# Patient Record
Sex: Female | Born: 1960 | Race: Black or African American | Hispanic: No | Marital: Single | Smoking: Former smoker
Health system: Southern US, Community
[De-identification: ages and names within clinical notes are randomized; demographics above are authoritative.]

## PROBLEM LIST (undated history)

## (undated) DIAGNOSIS — F319 Bipolar disorder, unspecified: Secondary | ICD-10-CM

## (undated) DIAGNOSIS — Z21 Asymptomatic human immunodeficiency virus [HIV] infection status: Secondary | ICD-10-CM

## (undated) DIAGNOSIS — J449 Chronic obstructive pulmonary disease, unspecified: Secondary | ICD-10-CM

## (undated) DIAGNOSIS — J45909 Unspecified asthma, uncomplicated: Secondary | ICD-10-CM

## (undated) HISTORY — PX: TUBAL LIGATION: SHX77

---

## 2014-03-25 ENCOUNTER — Emergency Department (HOSPITAL_COMMUNITY)
Admission: EM | Admit: 2014-03-25 | Discharge: 2014-03-26 | Disposition: A | Discharge status: Home / Self Care | Payer: Medicare Other | Attending: Emergency Medicine | Admitting: Emergency Medicine

## 2014-03-25 ENCOUNTER — Encounter (HOSPITAL_COMMUNITY): Payer: Self-pay | Admitting: Emergency Medicine

## 2014-03-25 DIAGNOSIS — Z880 Allergy status to penicillin: Secondary | ICD-10-CM | POA: Insufficient documentation

## 2014-03-25 DIAGNOSIS — Z21 Asymptomatic human immunodeficiency virus [HIV] infection status: Secondary | ICD-10-CM | POA: Insufficient documentation

## 2014-03-25 DIAGNOSIS — R45851 Suicidal ideations: Secondary | ICD-10-CM

## 2014-03-25 DIAGNOSIS — Z3202 Encounter for pregnancy test, result negative: Secondary | ICD-10-CM | POA: Insufficient documentation

## 2014-03-25 DIAGNOSIS — Z91148 Patient's other noncompliance with medication regimen for other reason: Secondary | ICD-10-CM

## 2014-03-25 DIAGNOSIS — Z720 Tobacco use: Secondary | ICD-10-CM | POA: Insufficient documentation

## 2014-03-25 DIAGNOSIS — R451 Restlessness and agitation: Secondary | ICD-10-CM

## 2014-03-25 DIAGNOSIS — F319 Bipolar disorder, unspecified: Secondary | ICD-10-CM

## 2014-03-25 DIAGNOSIS — Z79899 Other long term (current) drug therapy: Secondary | ICD-10-CM | POA: Insufficient documentation

## 2014-03-25 DIAGNOSIS — Z9114 Patient's other noncompliance with medication regimen: Secondary | ICD-10-CM | POA: Insufficient documentation

## 2014-03-25 DIAGNOSIS — F3160 Bipolar disorder, current episode mixed, unspecified: Secondary | ICD-10-CM

## 2014-03-25 DIAGNOSIS — N390 Urinary tract infection, site not specified: Secondary | ICD-10-CM

## 2014-03-25 HISTORY — DX: Bipolar disorder, unspecified: F31.9

## 2014-03-25 HISTORY — DX: Asymptomatic human immunodeficiency virus (hiv) infection status: Z21

## 2014-03-25 LAB — URINALYSIS, ROUTINE W REFLEX MICROSCOPIC
BILIRUBIN URINE: NEGATIVE
Glucose, UA: NEGATIVE mg/dL
HGB URINE DIPSTICK: NEGATIVE
KETONES UR: NEGATIVE mg/dL
Leukocytes, UA: NEGATIVE
NITRITE: POSITIVE — AB
PROTEIN: NEGATIVE mg/dL
SPECIFIC GRAVITY, URINE: 1.017 (ref 1.005–1.030)
UROBILINOGEN UA: 0.2 mg/dL (ref 0.0–1.0)
pH: 7.5 (ref 5.0–8.0)

## 2014-03-25 LAB — URINE MICROSCOPIC-ADD ON

## 2014-03-25 LAB — COMPREHENSIVE METABOLIC PANEL
ALT: 10 U/L (ref 0–35)
ANION GAP: 11 (ref 5–15)
AST: 20 U/L (ref 0–37)
Albumin: 3.7 g/dL (ref 3.5–5.2)
Alkaline Phosphatase: 47 U/L (ref 39–117)
BUN: 9 mg/dL (ref 6–23)
CO2: 25 mEq/L (ref 19–32)
Calcium: 9 mg/dL (ref 8.4–10.5)
Chloride: 103 mEq/L (ref 96–112)
Creatinine, Ser: 0.68 mg/dL (ref 0.50–1.10)
GFR calc Af Amer: >90 mL/min (ref >90)
GFR calc non Af Amer: >90 mL/min (ref >90)
GLUCOSE: 104 mg/dL — AB (ref 70–99)
POTASSIUM: 3.7 meq/L (ref 3.7–5.3)
SODIUM: 139 meq/L (ref 137–147)
Total Protein: 7.7 g/dL (ref 6.0–8.3)

## 2014-03-25 LAB — CBC
HEMATOCRIT: 36.7 % (ref 36.0–46.0)
HEMOGLOBIN: 12.2 g/dL (ref 12.0–15.0)
MCH: 26.6 pg (ref 26.0–34.0)
MCHC: 33.2 g/dL (ref 30.0–36.0)
MCV: 80.1 fL (ref 78.0–100.0)
Platelets: 237 10*3/uL (ref 150–400)
RBC: 4.58 MIL/uL (ref 3.87–5.11)
RDW: 14.9 % (ref 11.5–15.5)
WBC: 5.7 10*3/uL (ref 4.0–10.5)

## 2014-03-25 LAB — RAPID URINE DRUG SCREEN, HOSP PERFORMED
AMPHETAMINES: NOT DETECTED
BARBITURATES: NOT DETECTED
Benzodiazepines: NOT DETECTED
COCAINE: NOT DETECTED
Opiates: NOT DETECTED
Tetrahydrocannabinol: NOT DETECTED

## 2014-03-25 LAB — ETHANOL: Alcohol, Ethyl (B): <11 mg/dL (ref 0–11)

## 2014-03-25 LAB — ACETAMINOPHEN LEVEL

## 2014-03-25 LAB — PREGNANCY, URINE: Preg Test, Ur: NEGATIVE

## 2014-03-25 LAB — SALICYLATE LEVEL: Salicylate Lvl: <2 mg/dL — ABNORMAL LOW (ref 2.8–20.0)

## 2014-03-25 MED ORDER — ALUM & MAG HYDROXIDE-SIMETH 200-200-20 MG/5ML PO SUSP: 30.0000 mL | ORAL | Status: DC | PRN

## 2014-03-25 MED ORDER — ACETAMINOPHEN 325 MG PO TABS: 650.0000 mg | ORAL_TABLET | ORAL | Status: DC | PRN

## 2014-03-25 MED ORDER — NITROFURANTOIN MONOHYD MACRO 100 MG PO CAPS: 100.0000 mg | ORAL_CAPSULE | Freq: Once | ORAL | Status: DC

## 2014-03-25 MED ORDER — ONDANSETRON HCL 4 MG PO TABS: 4.0000 mg | ORAL_TABLET | Freq: Three times a day (TID) | ORAL | Status: DC | PRN

## 2014-03-25 MED ORDER — ZOLPIDEM TARTRATE 5 MG PO TABS
5.0000 mg | ORAL_TABLET | Freq: Every evening | ORAL | Status: DC | PRN
  Administered 2014-03-25: 5 mg via ORAL
  Filled 2014-03-25: qty 1

## 2014-03-25 MED ORDER — NICOTINE 21 MG/24HR TD PT24
21.0000 mg | MEDICATED_PATCH | Freq: Every day | TRANSDERMAL | Status: DC
Start: 2014-03-25 — End: 2014-03-26
  Administered 2014-03-25 – 2014-03-26 (×2): 21 mg via TRANSDERMAL
  Filled 2014-03-25 (×3): qty 1

## 2014-03-25 MED ORDER — NITROFURANTOIN MONOHYD MACRO 100 MG PO CAPS
100.0000 mg | ORAL_CAPSULE | Freq: Two times a day (BID) | ORAL | Status: DC
  Administered 2014-03-25 – 2014-03-26 (×4): 100 mg via ORAL
  Filled 2014-03-25 (×6): qty 1

## 2014-03-25 MED ORDER — IBUPROFEN 200 MG PO TABS: 600.0000 mg | ORAL_TABLET | Freq: Three times a day (TID) | ORAL | Status: DC | PRN

## 2014-03-25 MED ORDER — LORAZEPAM 1 MG PO TABS
1.0000 mg | ORAL_TABLET | Freq: Three times a day (TID) | ORAL | Status: DC | PRN
  Administered 2014-03-25: 1 mg via ORAL
  Filled 2014-03-25: qty 1

## 2014-03-25 MED ORDER — ELVITEG-COBIC-EMTRICIT-TENOFDF 150-150-200-300 MG PO TABS
1.0000 | ORAL_TABLET | Freq: Every day | ORAL | Status: DC
  Administered 2014-03-25 – 2014-03-26 (×2): 1 via ORAL
  Filled 2014-03-25 (×3): qty 1

## 2014-03-25 MED ORDER — QUETIAPINE FUMARATE 25 MG PO TABS
25.0000 mg | ORAL_TABLET | Freq: Every day | ORAL | Status: DC
  Administered 2014-03-25: 25 mg via ORAL
  Filled 2014-03-25: qty 1

## 2014-03-25 MED ORDER — OLANZAPINE 5 MG PO TBDP
5.0000 mg | ORAL_TABLET | Freq: Two times a day (BID) | ORAL | Status: DC
  Administered 2014-03-25 – 2014-03-26 (×3): 5 mg via ORAL
  Filled 2014-03-25 (×3): qty 1

## 2014-03-25 NOTE — ED Notes (Signed)
Pt Seroquel was discontinued because it should not be given with this pt's HIV medication: Stribild. RN called pharmacy and confirmed that these two medication Seroquel and Stribild should not be given together. Advised MD and extender. sbw,rn

## 2014-03-25 NOTE — Consult Note (Signed)
[facility] Consult   Reason for Consult:  angerand suicidal thoughts. Referring Physician:  EDP  Natalie Barrett is an 53 y.o. female. Total Time spent with patient: 20 minutes  Assessment: AXIS I:  Bipolar, mixed AXIS II:  Deferred AXIS III:   Past Medical History  Diagnosis Date  . Bipolar 1 disorder   . HIV antibody positive    AXIS IV:  housing problems, other psychosocial or environmental problems, problems related to social environment and problems with primary support group AXIS V:  41-50 serious symptoms  Plan:  medication management  Subjective:   Natalie Barrett is a 53 y.o. female patient admitted with suicidal thoughts and plan to kill herself by taking overdose.  HPI:  Patient seen chart reviewed.  Patient is 53 year old female who brought to the emergency room because of UTI and after he mentioned that she wants to kill herself by taking overdose on medication.  Patient is superficially cooperative.  She did not talk much and Very upset.  She mentioned that she has anger issues.  She has been noncompliant with her medication.  She has HIV however it is unclear if she is taking her HIV medication.  Currently she is homeless.  She used to live in [place] but recently moved to [place] a few weeks ago.  She endorse financial strains and having issues with her cousin.  Patient continued to express suicidal thoughts and did not contract for safety.  She admitted her depression and anger is getting worse since she is not taking her medication.  Patient denies any hallucination, paranoia or any delusions.  She did not cooperate to provide more information.  Past Psychiatric History: Past Medical History  Diagnosis Date  . Bipolar 1 disorder   . HIV antibody positive     reports that she has been smoking.  She has never used smokeless tobacco. She reports that she does not drink alcohol or use illicit drugs. History reviewed. No pertinent family history. Family  History Substance Abuse: No Family Supports: Yes, List: (Pt cites having close friends.) Living Arrangements: Other (Comment) (Homeless) Can pt return to current living arrangement?: Yes Abuse/Neglect [facility]) Physical Abuse: Denies Verbal Abuse: Denies Sexual Abuse: Denies Allergies:   Allergies  Allergen Reactions  . Penicillins Swelling    ACT Assessment Complete:  Yes:    Educational Status    Risk to Self: Risk to self with the past 6 months Suicidal Ideation: No-Not Currently/Within Last 6 Months Suicidal Intent: No Is patient at risk for suicide?: No Suicidal Plan?: Yes-Currently Present Specify Current Suicidal Plan: Overdose on medications Access to Means: Yes Specify Access to Suicidal Means: Medications What has been your use of drugs/alcohol within the last 12 months?: Denies use in last 11 years. Previous Attempts/Gestures: No How many times?: 0 Other Self Harm Risks: None Triggers for Past Attempts: None known Intentional Self Injurious Behavior: None Family Suicide History: No Recent stressful life event(s): Conflict (Comment);Turmoil (Comment) (Homelessness, conflict w/ cousin.) Persecutory voices/beliefs?: Yes Depression: Yes Depression Symptoms: Despondent;Feeling angry/irritable Substance abuse history and/or treatment for substance abuse?: Yes Suicide prevention information given to non-admitted patients: Not applicable  Risk to Others: Risk to Others within the past 6 months Homicidal Ideation: No Thoughts of Harm to Others: Yes-Currently Present Comment - Thoughts of Harm to Others: Pt states she could choke her cousin if she saw him now. Current Homicidal Intent: No Current Homicidal Plan: No Access to Homicidal Means: No Identified Victim: Natalie Barrett (wants to harm him) History  of harm to others?: No Assessment of Violence: None Noted Violent Behavior Description: Pt denies Does patient have access to weapons?: No Criminal Charges Pending?: No Does  patient have a court date: No  Abuse: Abuse/Neglect Assessment (Assessment to be complete while patient is alone) Physical Abuse: Denies Verbal Abuse: Denies Sexual Abuse: Denies Exploitation of patient/patient's resources: Denies Self-Neglect: Denies  Prior Inpatient Therapy: Prior Inpatient Therapy Prior Inpatient Therapy: Yes Prior Therapy Dates: Four months ago Prior Therapy Facilty/Provider(s): [facility] Reason for Treatment: Bi-polar d/o  Prior Outpatient Therapy: Prior Outpatient Therapy Prior Outpatient Therapy: Yes Prior Therapy Dates: Past 11 years Prior Therapy Facilty/Provider(s): [facility] Reason for Treatment: Bi polar  Additional Information: Additional Information 1:1 In Past 12 Months?: No CIRT Risk: No Elopement Risk: No Does patient have medical clearance?: Yes                  Objective: Blood pressure 120/70, pulse 83, temperature 98.3 F (36.8 C), temperature source Oral, resp. rate 20, height '5\' 4"'  (1.626 m), weight 56.246 kg (124 lb), last menstrual period 02/23/2014, SpO2 98.00%.Body mass index is 21.27 kg/(m^2). Results for orders placed during the hospital encounter of 03/25/14 (from the past 72 hour(s))  ACETAMINOPHEN LEVEL     Status: None   Collection Time    03/25/14  1:12 AM      Result Value Ref Range   Acetaminophen (Tylenol), Serum <15.0  10 - 30 ug/mL   Comment:            THERAPEUTIC CONCENTRATIONS VARY     SIGNIFICANTLY. A RANGE OF 10-30     ug/mL MAY BE AN EFFECTIVE     CONCENTRATION FOR MANY PATIENTS.     HOWEVER, SOME ARE BEST TREATED     AT CONCENTRATIONS OUTSIDE THIS     RANGE.     ACETAMINOPHEN CONCENTRATIONS     >150 ug/mL AT 4 HOURS AFTER     INGESTION AND >50 ug/mL AT 12     HOURS AFTER INGESTION ARE     OFTEN ASSOCIATED WITH TOXIC     REACTIONS.  CBC     Status: None   Collection Time    03/25/14  1:12 AM      Result Value Ref Range   WBC 5.7  4.0 - 10.5 K/uL   RBC 4.58  3.87 - 5.11  MIL/uL   Hemoglobin 12.2  12.0 - 15.0 g/dL   HCT 36.7  36.0 - 46.0 %   MCV 80.1  78.0 - 100.0 fL   MCH 26.6  26.0 - 34.0 pg   MCHC 33.2  30.0 - 36.0 g/dL   RDW 14.9  11.5 - 15.5 %   Platelets 237  150 - 400 K/uL  COMPREHENSIVE METABOLIC PANEL     Status: Abnormal   Collection Time    03/25/14  1:12 AM      Result Value Ref Range   Sodium 139  137 - 147 mEq/L   Potassium 3.7  3.7 - 5.3 mEq/L   Chloride 103  96 - 112 mEq/L   CO2 25  19 - 32 mEq/L   Glucose, Bld 104 (*) 70 - 99 mg/dL   BUN 9  6 - 23 mg/dL   Creatinine, Ser 0.68  0.50 - 1.10 mg/dL   Calcium 9.0  8.4 - 10.5 mg/dL   Total Protein 7.7  6.0 - 8.3 g/dL   Albumin 3.7  3.5 - 5.2 g/dL   AST 20  0 - 37 U/L   ALT 10  0 - 35 U/L   Alkaline Phosphatase 47  39 - 117 U/L   Total Bilirubin <0.2 (*) 0.3 - 1.2 mg/dL   GFR calc non Af Amer >90  >90 mL/min   GFR calc Af Amer >90  >90 mL/min   Comment: (NOTE)     The eGFR has been calculated using the CKD EPI equation.     This calculation has not been validated in all clinical situations.     eGFR's persistently <90 mL/min signify possible Chronic Kidney     Disease.   Anion gap 11  5 - 15  ETHANOL     Status: None   Collection Time    03/25/14  1:12 AM      Result Value Ref Range   Alcohol, Ethyl (B) <11  0 - 11 mg/dL   Comment:            LOWEST DETECTABLE LIMIT FOR     SERUM ALCOHOL IS 11 mg/dL     FOR MEDICAL PURPOSES ONLY  SALICYLATE LEVEL     Status: Abnormal   Collection Time    03/25/14  1:12 AM      Result Value Ref Range   Salicylate Lvl <9.3 (*) 2.8 - 20.0 mg/dL  URINE RAPID DRUG SCREEN (HOSP PERFORMED)     Status: None   Collection Time    03/25/14  1:12 AM      Result Value Ref Range   Opiates NONE DETECTED  NONE DETECTED   Cocaine NONE DETECTED  NONE DETECTED   Benzodiazepines NONE DETECTED  NONE DETECTED   Amphetamines NONE DETECTED  NONE DETECTED   Tetrahydrocannabinol NONE DETECTED  NONE DETECTED   Barbiturates NONE DETECTED  NONE DETECTED    Comment:            DRUG SCREEN FOR MEDICAL PURPOSES     ONLY.  IF CONFIRMATION IS NEEDED     FOR ANY PURPOSE, NOTIFY LAB     WITHIN 5 DAYS.                LOWEST DETECTABLE LIMITS     FOR URINE DRUG SCREEN     Drug Class       Cutoff (ng/mL)     Amphetamine      1000     Barbiturate      200     Benzodiazepine   235     Tricyclics       573     Opiates          300     Cocaine          300     THC              50  URINALYSIS, ROUTINE W REFLEX MICROSCOPIC     Status: Abnormal   Collection Time    03/25/14  1:12 AM      Result Value Ref Range   Color, Urine YELLOW  YELLOW   APPearance CLOUDY (*) CLEAR   Specific Gravity, Urine 1.017  1.005 - 1.030   pH 7.5  5.0 - 8.0   Glucose, UA NEGATIVE  NEGATIVE mg/dL   Hgb urine dipstick NEGATIVE  NEGATIVE   Bilirubin Urine NEGATIVE  NEGATIVE   Ketones, ur NEGATIVE  NEGATIVE mg/dL   Protein, ur NEGATIVE  NEGATIVE mg/dL   Urobilinogen, UA 0.2  0.0 - 1.0 mg/dL   Nitrite POSITIVE (*)  NEGATIVE   Leukocytes, UA NEGATIVE  NEGATIVE  PREGNANCY, URINE     Status: None   Collection Time    03/25/14  1:12 AM      Result Value Ref Range   Preg Test, Ur NEGATIVE  NEGATIVE   Comment:            THE SENSITIVITY OF THIS     METHODOLOGY IS >20 mIU/mL.  URINE MICROSCOPIC-ADD ON     Status: Abnormal   Collection Time    03/25/14  1:12 AM      Result Value Ref Range   Squamous Epithelial / LPF FEW (*) RARE   Bacteria, UA MANY (*) RARE   Urine-Other MUCOUS PRESENT     Labs are reviewed.  Current Facility-Administered Medications  Medication Dose Route Frequency Provider Last Rate Last Dose  . acetaminophen (TYLENOL) tablet 650 mg  650 mg Oral Q4H PRN Antonietta Breach, PA-C      . alum & mag hydroxide-simeth (MAALOX/MYLANTA) 200-200-20 MG/5ML suspension 30 mL  30 mL Oral PRN Antonietta Breach, PA-C      . elvitegravir-cobicistat-emtricitabine-tenofovir (STRIBILD) 150-150-200-300 MG tablet 1 tablet  1 tablet Oral Q breakfast Antonietta Breach, PA-C   1 tablet at  03/25/14 0847  . ibuprofen (ADVIL,MOTRIN) tablet 600 mg  600 mg Oral Q8H PRN Antonietta Breach, PA-C      . LORazepam (ATIVAN) tablet 1 mg  1 mg Oral Q8H PRN Antonietta Breach, PA-C   1 mg at 03/25/14 0229  . nicotine (NICODERM CQ - dosed in mg/24 hours) patch 21 mg  21 mg Transdermal Daily Antonietta Breach, PA-C   21 mg at 03/25/14 0848  . nitrofurantoin (macrocrystal-monohydrate) (MACROBID) capsule 100 mg  100 mg Oral BID WC Antonietta Breach, PA-C   100 mg at 03/25/14 0848  . OLANZapine zydis (ZYPREXA) disintegrating tablet 5 mg  5 mg Oral BID Kathlee Nations, MD   5 mg at 03/25/14 1009  . ondansetron (ZOFRAN) tablet 4 mg  4 mg Oral Q8H PRN Antonietta Breach, PA-C      . zolpidem (AMBIEN) tablet 5 mg  5 mg Oral QHS PRN Antonietta Breach, PA-C   5 mg at 03/25/14 0229   Current Outpatient Prescriptions  Medication Sig Dispense Refill  . elvitegravir-cobicistat-emtricitabine-tenofovir (STRIBILD) 150-150-200-300 MG TABS tablet Take 1 tablet by mouth daily with breakfast.      . QUEtiapine (SEROQUEL) 25 MG tablet Take 25 mg by mouth at bedtime.        Psychiatric Specialty Exam:     Blood pressure 120/70, pulse 83, temperature 98.3 F (36.8 C), temperature source Oral, resp. rate 20, height '5\' 4"'  (1.626 m), weight 56.246 kg (124 lb), last menstrual period 02/23/2014, SpO2 98.00%.Body mass index is 21.27 kg/(m^2).  General Appearance: Disheveled and Guarded  Eye Contact::  Poor  Speech:  Pressured  Volume:  Increased  Mood:  Depressed, Hopeless and Irritable  Affect:  Constricted, Depressed and Labile  Thought Process:  Circumstantial and Loose  Orientation:  Full (Time, Place, and Person)  Thought Content:  Rumination  Suicidal Thoughts:  Yes.  with intent/plan  Homicidal Thoughts:  No  Memory:  Immediate;   Poor Recent;   Fair Remote;   Fair  Judgement:  Impaired  Insight:  Shallow  Psychomotor Activity:  Increased  Concentration:  Poor  Recall:  Poor  Fund of Knowledge:Fair  Language: Fair  Akathisia:  No   Handed:  Right  AIMS (if indicated):     Assets:  Communication  Skills  Sleep:      Musculoskeletal: Strength & Muscle Tone: within normal limits Gait & Station: normal Patient leans: N/A  Treatment Plan Summary: Medication management, start Zyprexa 5 mg twice a day.  Increase collateral information from the family.  Stabilize patient and to reevaluate in the morning.  Treat UTI and restart her at HIV medication.  ARFEEN,SYED T. 03/25/2014 4:49 PM

## 2014-03-25 NOTE — ED Provider Notes (Signed)
Medical screening examination/treatment/procedure(s) were performed by non-physician practitioner and as supervising physician I was immediately available for consultation/collaboration.   EKG Interpretation None        Tomasita CrumbleAdeleke Ebany Bowermaster, MD 03/25/14 1340

## 2014-03-25 NOTE — BH Assessment (Signed)
Assessment Note  Natalie Barrett is an 53 y.o. female.  -Clinician noted the documentation provided by Antony MaduraKelly Humes, PA.  She saw patient who initially came in because of UTI and thoughts of killing herself by overdosing.  Patient is very upset with her cousin.  She had been living in [place] and moved to [place] in the last few weeks to help her cousin who has health problems.  Patient herself says she has bipolar d/o and HIV.  She says that cousin has cheated her out of money and she has had to move since the initial move to [place].  At this time she is homeless.  She goes to length to talk about how much she is angry with cousin.  When asked if she was having thoughts about wanting to hurt anyone she says "If my cousin were here I would choke him."  When asked if she wants to kill herself she says, "no."  She does say however that she knows her depression is getting worse and that she needed to seek help.  Patient has been without her seroquel because of the move from [place].  Patient denies any previous actual attempt.  She does say that she feels like killing herself often.  Patient denies A/V hallucinations.  -Clinician [facility].  We discussed patient being seen by psychiatry in the AM to determine disposition.    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 Bipolar 1 disorder   . HIV antibody positive    Axis IV: economic problems, housing problems, occupational problems and problems with primary support group Axis V: 41-50 serious symptoms  Past Medical History:  Past Medical History  Diagnosis Date  . Bipolar 1 disorder   . HIV antibody positive     History reviewed. No pertinent past surgical history.  Family History: History reviewed. No pertinent family history.  Social History:  reports that she has been smoking.  She has never used smokeless tobacco. She reports that she does not drink  alcohol or use illicit drugs.  Additional Social History:  Alcohol / Drug Use Pain Medications: See PTA medication list Prescriptions: See PTA medication list Over the Counter: See PTA medication list History of alcohol / drug use?: No history of alcohol / drug abuse (Pt reports not using ETOH or drugs in 11 years.)  CIWA: CIWA-Ar BP: 138/95 mmHg Pulse Rate: 88 COWS:    Allergies:  Allergies  Allergen Reactions  . Penicillins Swelling    Home Medications:  (Not in a hospital admission)  OB/GYN Status:  Patient's last menstrual period was 02/23/2014.  General Assessment Data Location of Assessment: [facility] ED Is this a Tele or Face-to-Face Assessment?: Face-to-Face Is this an Initial Assessment or a Re-assessment for this encounter?: Initial Assessment Living Arrangements: Other (Comment) (Homeless) Can pt return to current living arrangement?: Yes Admission Status: Voluntary Is patient capable of signing voluntary admission?: Yes Transfer from: Acute Hospital Referral Source: Self/Family/Friend     [facility] Living Arrangements: Other (Comment) (Homeless) [facility] Name of Therapist: None     Risk to self with the past 6 months Suicidal Ideation: No-Not Currently/Within Last 6 Months Suicidal Intent: No Is patient at risk for suicide?: No Suicidal Plan?: Yes-Currently Present Specify Current Suicidal Plan: Overdose on medications Access to Means: Yes Specify Access to Suicidal Means: Medications What has been your use of  drugs/alcohol within the last 12 months?: Denies use in last 11 years. Previous Attempts/Gestures: No How many times?: 0 Other Self Harm Risks: None Triggers for Past Attempts: None known Intentional Self Injurious Behavior: None Family Suicide History: No Recent stressful life event(s): Conflict (Comment);Turmoil (Comment) (Homelessness, conflict w/ cousin.) Persecutory voices/beliefs?:  Yes Depression: Yes Depression Symptoms: Despondent;Feeling angry/irritable Substance abuse history and/or treatment for substance abuse?: Yes Suicide prevention information given to non-admitted patients: Not applicable  Risk to Others within the past 6 months Homicidal Ideation: No Thoughts of Harm to Others: Yes-Currently Present Comment - Thoughts of Harm to Others: Pt states she could choke her cousin if she saw him now. Current Homicidal Intent: No Current Homicidal Plan: No Access to Homicidal Means: No Identified Victim: Wonda OldsCousin (wants to harm him) History of harm to others?: No Assessment of Violence: None Noted Violent Behavior Description: Pt denies Does patient have access to weapons?: No Criminal Charges Pending?: No Does patient have a court date: No  Psychosis Hallucinations: None noted Delusions: None noted  Mental Status Report Appear/Hygiene: Disheveled;In scrubs Eye Contact: Good Motor Activity: Freedom of movement;Unremarkable Speech: Logical/coherent Level of Consciousness: Alert;Irritable Mood: Anxious;Irritable;Helpless Affect: Anxious;Irritable Anxiety Level: Moderate Thought Processes: Coherent;Tangential Judgement: Unimpaired Orientation: Person;Place;Situation Obsessive Compulsive Thoughts/Behaviors: None  Cognitive Functioning Concentration: Normal Memory: Recent Intact;Remote Intact IQ: Average Insight: Fair Impulse Control: Good Appetite: Good Weight Loss: 0 Weight Gain: 0 Sleep: No Change Total Hours of Sleep: 6 Vegetative Symptoms: None  ADLScreening [facility]) Patient's cognitive ability adequate to safely complete daily activities?: Yes Patient able to express need for assistance with ADLs?: Yes Independently performs ADLs?: Yes (appropriate for developmental age)  Prior Inpatient Therapy Prior Inpatient Therapy: Yes Prior Therapy Dates: Four months ago Prior Therapy Facilty[facility]Provider[facility] Reason for  Treatment: Bi-polar d/o  Prior Outpatient Therapy Prior Outpatient Therapy: Yes Prior Therapy Dates: Past 11 years Prior Therapy [facility]ilty[facility]Provider([facility] Reason for Treatment: Bi polar  ADL Screening (condition at time of admission) Patient's cognitive ability adequate to safely complete daily activities?: Yes Is the patient deaf or have difficulty hearing?: No Does the patient have difficulty seeing, even when wearing glasses/contacts?: No Does the patient have difficulty concentrating, remembering, or making decisions?: No Patient able to express need for assistance with ADLs?: Yes Does the patient have difficulty dressing or bathing?: No Independently performs ADLs?: Yes (appropriate for developmental age) Does the patient have difficulty walking or climbing stairs?: No Weakness of Legs: None Weakness of Arms/Hands: None       Abuse/Neglect Assessment (Assessment to be complete while patient is alone) Physical Abuse: Denies Verbal Abuse: Denies Sexual Abuse: Denies Exploitation of patient/patient's resources: Denies Self-Neglect: Denies Values / Beliefs Cultural Requests During Hospitalization: None Spiritual Requests During Hospitalization: None   Advance Directives (For Healthcare) Does patient have an advance directive?: No Would patient like information on creating an advanced directive?: No - patient declined information    Additional Information 1:1 In Past 12 Months?: No CIRT Risk: No Elopement Risk: No Does patient have medical clearance?: Yes     Disposition:  Disposition Initial Assessment Completed for this Encounter: Yes Disposition of Patient: Other dispositions Other disposition(s): Other (Comment) (To be seen in AM for psychiatric evaluation.)  On Site Evaluation by:   Reviewed with Physician:    Alexandria LodgeHarvey, Denys Labree Ray 03/25/2014 3:54 AM

## 2014-03-25 NOTE — ED Provider Notes (Signed)
CSN: [id number]     Arrival date & time 03/25/14  0005 History   First MD Initiated Contact with Patient 03/25/14 0020     Chief Complaint  Patient presents with  . Suicidal    (Consider location/radiation/quality/duration/timing/severity/associated sxs/prior Treatment) HPI Comments: 53 year old female with a history of bipolar 1 disorder and HIV presents to the emergency department for increased anger and suicidal thoughts. Patient states that she has been off her psychiatric medication x3 weeks. She states that she has been contemplating suicide, but has no plan. She believes that her symptoms have worsened because she has been off her medications. She states that she cannot afford them which is why she has not been taking them. Patient has secondary complaint of foul urinary odor. She denies fever, dysuria, hematuria, vaginal bleeding, and vaginal discharge. No abdominal pain or vomiting. Patient denies HI, illicit drug use, and alcohol use.  The history is provided by the patient. No language interpreter was used.    Past Medical History  Diagnosis Date  . Bipolar 1 disorder   . HIV antibody positive    History reviewed. No pertinent past surgical history. History reviewed. No pertinent family history. History  Substance Use Topics  . Smoking status: Current Some Day Smoker  . Smokeless tobacco: Never Used  . Alcohol Use: No   OB History   Grav Para Term Preterm Abortions TAB SAB Ect Mult Living                  Review of Systems  Constitutional: Negative for fever.  Gastrointestinal: Negative for vomiting and abdominal pain.  Genitourinary: Negative for dysuria, hematuria and vaginal discharge.       +urinary odor  Neurological: Negative for syncope and weakness.  Psychiatric/Behavioral: Positive for suicidal ideas, behavioral problems and agitation.  All other systems reviewed and are negative.   Allergies  Penicillins  Home Medications   Prior to Admission  medications   Medication Sig Start Date End Date Taking? Authorizing Provider  elvitegravir-cobicistat-emtricitabine-tenofovir (STRIBILD) 150-150-200-300 MG TABS tablet Take 1 tablet by mouth daily with breakfast.   Yes Historical Provider, MD  QUEtiapine (SEROQUEL) 25 MG tablet Take 25 mg by mouth at bedtime.   Yes Historical Provider, MD   BP 122/77  Pulse 85  Temp(Src) 97.8 F (36.6 C) (Oral)  Resp 12  Ht 5\' 4"  (1.626 m)  Wt 124 lb (56.246 kg)  BMI 21.27 kg/m2  SpO2 98%  LMP 02/23/2014  Physical Exam  Nursing note and vitals reviewed. Constitutional: She is oriented to person, place, and time. She appears well-developed and well-nourished. No distress.  Nontoxic/nonseptic appearing  HENT:  Head: Normocephalic and atraumatic.  Eyes: Conjunctivae and EOM are normal. No scleral icterus.  Neck: Normal range of motion.  Pulmonary/Chest: Effort normal. No respiratory distress.  Musculoskeletal: Normal range of motion.  Neurological: She is alert and oriented to person, place, and time.  Skin: Skin is warm and dry. No rash noted. She is not diaphoretic. No erythema. No pallor.  Psychiatric: She has a normal mood and affect. Her speech is normal. She expresses suicidal ideation. She expresses no homicidal ideation. She expresses no suicidal plans and no homicidal plans.  Patient cheerful, pleasant and cooperative    ED Course  Procedures (including critical care time) Labs Review Labs Reviewed  COMPREHENSIVE METABOLIC PANEL - Abnormal; Notable for the following:    Glucose, Bld 104 (*)    Total Bilirubin <0.2 (*)    All other components within  normal limits  SALICYLATE LEVEL - Abnormal; Notable for the following:    Salicylate Lvl <2.0 (*)    All other components within normal limits  URINALYSIS, ROUTINE W REFLEX MICROSCOPIC - Abnormal; Notable for the following:    APPearance CLOUDY (*)    Nitrite POSITIVE (*)    All other components within normal limits  URINE  MICROSCOPIC-ADD ON - Abnormal; Notable for the following:    Squamous Epithelial / LPF FEW (*)    Bacteria, UA MANY (*)    All other components within normal limits  URINE CULTURE  ACETAMINOPHEN LEVEL  CBC  ETHANOL  URINE RAPID DRUG SCREEN (HOSP PERFORMED)  PREGNANCY, URINE   Imaging Review No results found.   EKG Interpretation None      MDM   Final diagnoses:  Agitation  Noncompliance with medication regimen  Suicidal ideations  UTI (lower urinary tract infection)    53 year old female presents to the emergency department for further evaluation of agitation and suicidal ideations. Patient states that she has been noncompliant with her psychiatric regimen x3 weeks because she cannot afford her medications. Patient expresses passive suicidal thoughts without plan. She has a secondary complaint of dysuria. Urinalysis consistent with urinary tract infection. Patient given first dose of Macrobid in ED. Should patient be discharged from the emergency department, she will require prescription for same. Labs otherwise unremarkable and patient medically cleared. She has been evaluated by TTS and is pending psychiatric evaluation in the morning.   Filed Vitals:   03/25/14 0018 03/25/14 0628  BP: 138/95 122/77  Pulse: 88 85  Temp: 98.8 F (37.1 C) 97.8 F (36.6 C)  TempSrc: Oral Oral  Resp: 18 12  Height: 5\' 4"  (1.626 m)   Weight: 124 lb (56.246 kg)   SpO2: 97% 98%     Antony MaduraKelly Calden Dorsey, PA-C 03/25/14 [phone]

## 2014-03-25 NOTE — ED Notes (Signed)
Pt arrived to the ED with a complaint of UTI symptoms and being suicidal.  Pt states her urine is amber in color and has a foul odor. Pt states she has urinary frequency.  Pt denies painful or burning urination.  Pt also states she is out of her Seroquel for a week and notes that her anger level has increased dramatically.  Pt states she has suicidal indeations with a plan to use pills to commit the act but then in the same sentence , laughs and says but that will never happen.

## 2014-03-25 NOTE — ED Notes (Signed)
[facility] Pt. Alert and oriented in no distress denies SI, HI, AVH and pain. Will continue to monitor for safety. Pt. Instructed to come to me with problems or concerns. Q 15 minute checks continue.

## 2014-03-25 NOTE — ED Notes (Signed)
Pt has been reminded on 2 different occasions that she needs to provide a urine sample for her urine culture.

## 2014-03-26 ENCOUNTER — Encounter (HOSPITAL_COMMUNITY): Payer: Self-pay | Admitting: Registered Nurse

## 2014-03-26 DIAGNOSIS — R45851 Suicidal ideations: Secondary | ICD-10-CM

## 2014-03-26 DIAGNOSIS — F3160 Bipolar disorder, current episode mixed, unspecified: Secondary | ICD-10-CM | POA: Diagnosis present

## 2014-03-26 MED ORDER — NITROFURANTOIN MONOHYD MACRO 100 MG PO CAPS: 100.0000 mg | ORAL_CAPSULE | Freq: Two times a day (BID) | ORAL | Status: AC

## 2014-03-26 MED ORDER — OLANZAPINE 5 MG PO TBDP: 5.0000 mg | ORAL_TABLET | Freq: Two times a day (BID) | ORAL | Status: AC

## 2014-03-26 NOTE — Progress Notes (Signed)
CSW spoke with Tammy SoursGreg with [facility] about available beds and he reports possible bed but for me to call back at 4:30pm.    Maryelizabeth Rowanressa Matsuko Kretz, MSW, [facility] Evening Clinical Social Worker [phone]
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
## 2014-03-26 NOTE — Progress Notes (Signed)
CSW spoke with shelter staff to confirm available bed for the patient.  CSW informed the nursing staff [facility] and provided a cab voucher.    Maryelizabeth Rowanressa Earle Troiano, MSW, LCSWA Evening Clinical Social Worker [phone]

## 2014-03-26 NOTE — ED Notes (Signed)
TTS into see 

## 2014-03-26 NOTE — Discharge Instructions (Signed)
Bipolar Disorder °Bipolar disorder is a mental illness. The term bipolar disorder actually is used to describe a group of disorders that all share varying degrees of emotional highs and lows that can interfere with daily functioning, such as work, school, or relationships. Bipolar disorder also can lead to drug abuse, hospitalization, and suicide. °The emotional highs of bipolar disorder are periods of elation or irritability and high energy. These highs can range from a mild form (hypomania) to a severe form (mania). People experiencing episodes of hypomania may appear energetic, excitable, and highly productive. People experiencing mania may behave impulsively or erratically. They often make poor decisions. They may have difficulty sleeping. The most severe episodes of mania can involve having very distorted beliefs or perceptions about the world and seeing or hearing things that are not real (psychotic delusions and hallucinations).  °The emotional lows of bipolar disorder (depression) also can range from mild to severe. Severe episodes of bipolar depression can involve psychotic delusions and hallucinations. °Sometimes people with bipolar disorder experience a state of mixed mood. Symptoms of hypomania or mania and depression are both present during this mixed-mood episode. °SIGNS AND SYMPTOMS °There are signs and symptoms of the episodes of hypomania and mania as well as the episodes of depression. The signs and symptoms of hypomania and mania are similar but vary in severity. They include: °· Inflated self-esteem or feeling of increased self-confidence. °· Decreased need for sleep. °· Unusual talkativeness (rapid or pressured speech) or the feeling of a need to keep talking. °· Sensation of racing thoughts or constant talking, with quick shifts between topics that may or may not be related (flight of ideas). °· Decreased ability to focus or concentrate. °· Increased purposeful activity, such as work, studies,  or social activity, or nonproductive activity, such as pacing, squirming and fidgeting, or finger and toe tapping. °· Impulsive behavior and use of poor judgment, resulting in high-risk activities, such as having unprotected sex or spending excessive amounts of money. °Signs and symptoms of depression include the following:  °· Feelings of sadness, hopelessness, or helplessness. °· Frequent or uncontrollable episodes of crying. °· Lack of feeling anything or caring about anything. °· Difficulty sleeping or sleeping too much.  °· Inability to enjoy the things you used to enjoy.   °· Desire to be alone all the time.   °· Feelings of guilt or worthlessness.  °· Lack of energy or motivation.   °· Difficulty concentrating, remembering, or making decisions.  °· Change in appetite or weight beyond normal fluctuations. °· Thoughts of death or the desire to harm yourself. °DIAGNOSIS  °Bipolar disorder is diagnosed through an assessment by your caregiver. Your caregiver will ask questions about your emotional episodes. There are two main types of bipolar disorder. People with type I bipolar disorder have manic episodes with or without depressive episodes. People with type II bipolar disorder have hypomanic episodes and major depressive episodes, which are more serious than mild depression. The type of bipolar disorder you have can make an important difference in how your illness is monitored and treated. °Your caregiver may ask questions about your medical history and use of alcohol or drugs, including prescription medication. Certain medical conditions and substances also can cause emotional highs and lows that resemble bipolar disorder (secondary bipolar disorder).  °TREATMENT  °Bipolar disorder is a long-term illness. It is best controlled with continuous treatment rather than treatment only when symptoms occur. The following treatments can be prescribed for bipolar disorders: °· Medication--Medication can be prescribed by  a doctor that   is an expert in treating mental disorders (psychiatrists). Medications called mood stabilizers are usually prescribed to help control the illness. Other medications are sometimes added if symptoms of mania, depression, or psychotic delusions and hallucinations occur despite the use of a mood stabilizer.  Talk therapy--Some forms of talk therapy are helpful in providing support, education, and guidance. A combination of medication and talk therapy is best for managing the disorder over time. A procedure in which electricity is applied to your brain through your scalp (electroconvulsive therapy) is used in cases of severe mania when medication and talk therapy do not work or work too slowly. Document Released: 08/25/2000 Document Revised: 09/13/2012 Document Reviewed: 06/14/2012 [facility] Patient Information 2015 [place], [place]. This information is not intended to replace advice given to you by your health care provider. Make sure you discuss any questions you have with your health care provider.  Depression Depression is feeling sad, low, down in the dumps, blue, gloomy, or empty. In general, there are two kinds of depression:  Normal sadness or grief. This can happen after something upsetting. It often goes away on its own within 2 weeks. After losing a loved one (bereavement), normal sadness and grief may last longer than two weeks. It usually gets better with time.  Clinical depression. This kind lasts longer than normal sadness or grief. It keeps you from doing the things you normally do in life. It is often hard to function at home, work, or at school. It may affect your relationships with others. Treatment is often needed. GET HELP RIGHT AWAY IF:  You have thoughts about hurting yourself or others.  You lose touch with reality (psychotic symptoms). You may:  See or hear things that are not real.  Have untrue beliefs about your life or people around you.  Your medicine is  giving you problems. MAKE SURE YOU:  Understand these instructions.  Will watch your condition.  Will get help right away if you are not doing well or get worse. Document Released: 06/21/2010 Document Revised: 10/03/2013 Document Reviewed: 09/18/2011 [facility]Exit[facility] Patient Information 2015 [place], [place]. This information is not intended to replace advice given to you by your health care provider. Make sure you discuss any questions you have with your health care provider.

## 2014-03-26 NOTE — ED Notes (Signed)
Pt to go to [facility] by cab voucher provided by CSW

## 2014-03-26 NOTE — ED Notes (Addendum)
Written dc instructions reviewed w/ pt.  Pt encouraged to follow up w/ [facility] next week, take her medications as directed.  Follow up w/ her MD and OP resources and return/seek treatment for suicidal/homicidal thoughts or urges.  Pt verbalized understanding.  Pt ambulatory to dc window w/ [facility], belongings returned after leaving the unit.  Cab voucher provided by CSW.

## 2014-03-26 NOTE — [facility] Suicide Risk Assessment (Cosign Needed)
Suicide Risk Assessment  Discharge Assessment     Demographic Factors:  Caucasian  Total Time spent with patient: 30 minutes  Psychiatric Specialty Exam:      Blood pressure 124/70, pulse 58, temperature 97.9 F (36.6 C), temperature source Oral, resp. rate 16, height 5\' 4"  (1.626 m), weight 56.246 kg (124 lb), last menstrual period 02/23/2014, SpO2 99.00%.Body mass index is 21.27 kg/(m^2).   General Appearance: Disheveled and Guarded   Eye Contact:: Poor   Speech: Pressured   Volume: Increased   Mood: Depressed, Hopeless and Irritable   Affect: Constricted, Depressed and Labile   Thought Process: Circumstantial and Loose   Orientation: Full (Time, Place, and Person)   Thought Content: Rumination   Suicidal Thoughts: Yes. with intent/plan   Homicidal Thoughts: No   Memory: Immediate; Poor  Recent; Fair  Remote; Fair   Judgement: Impaired   Insight: Shallow   Psychomotor Activity: Increased   Concentration: Poor   Recall: Poor   Fund of Knowledge:Fair   Language: Fair   Akathisia: No   Handed: Right   AIMS (if indicated):   Assets: Communication Skills   Sleep:   Musculoskeletal:  Strength & Muscle Tone: within normal limits  Gait & Station: normal  Patient leans: N/A   Mental Status Per Nursing Assessment::   On Admission:     Current Mental Status by Physician: Patient denies suicidal/homicidal ideation, psychosis, and paranoia  Loss Factors: NA  Historical Factors: NA  Risk Reduction Factors:   Religious beliefs about death and Positive social support  Continued Clinical Symptoms:  Alcohol/Substance Abuse/Dependencies  Cognitive Features That Contribute To Risk:  Closed-mindedness    Suicide Risk:  Minimal: No identifiable suicidal ideation.  Patients presenting with no risk factors but with morbid ruminations; may be classified as minimal risk based on the severity of the depressive symptoms  Discharge Diagnoses:  AXIS I: Bipolar, mixed   AXIS II: Deferred  AXIS III:  Past Medical History   Diagnosis  Date   .  Bipolar 1 disorder    .  HIV antibody positive     AXIS IV: housing problems, other psychosocial or environmental problems, problems related to social environment and problems with primary support group  AXIS V: 61-70 mild symptoms     Plan Of Care/Follow-up recommendations:  Activity:  as tolerated Diet:  as tolerated  Is patient on multiple antipsychotic therapies at discharge:  No   Has Patient had three or more failed trials of antipsychotic monotherapy by history:  No  Recommended Plan for Multiple Antipsychotic Therapies: NA    Natalie Neises, FNP-BC 03/26/2014, 12:52 PM

## 2014-03-27 LAB — URINE CULTURE

## 2014-03-28 ENCOUNTER — Telehealth (HOSPITAL_COMMUNITY): Payer: Self-pay

## 2014-03-28 NOTE — ED Notes (Signed)
Post ED Visit - Positive Culture Follow-up  Culture report reviewed by antimicrobial stewardship pharmacist: []  Wes Dulaney, Pharm.D., BCPS [x]  Celedonio MiyamotoJeremy Frens, [address]Ph[address] BCPS []  Georgina PillionElizabeth Martin, Pharm.D., BCPS []  MildredMinh Pham, [address]Ph[address]., BCPS, AAHIVP []  Estella HuskMichelle Turner, Pharm.D., BCPS, AAHIVP []  Carly Sabat, Pharm.D. []  Enzo BiNathan Batchelder, [address]Ph[address]  Positive urine culture Treated with nitrofurantoin, organism sensitive to the same and no further patient follow-up is required at this time.  Ashley JacobsFesterman, Diamone Whistler C 03/28/2014, 10:03 AM

## 2014-04-02 ENCOUNTER — Emergency Department (HOSPITAL_COMMUNITY)
Admission: EM | Admit: 2014-04-02 | Discharge: 2014-04-02 | Disposition: A | Discharge status: Home / Self Care | Payer: Medicare Other | Attending: Emergency Medicine | Admitting: Emergency Medicine

## 2014-04-02 ENCOUNTER — Encounter (HOSPITAL_COMMUNITY): Payer: Self-pay | Admitting: Emergency Medicine

## 2014-04-02 DIAGNOSIS — Z5900 Homelessness unspecified: Secondary | ICD-10-CM

## 2014-04-02 DIAGNOSIS — F319 Bipolar disorder, unspecified: Secondary | ICD-10-CM | POA: Insufficient documentation

## 2014-04-02 DIAGNOSIS — Z720 Tobacco use: Secondary | ICD-10-CM | POA: Insufficient documentation

## 2014-04-02 DIAGNOSIS — R109 Unspecified abdominal pain: Secondary | ICD-10-CM | POA: Diagnosis present

## 2014-04-02 DIAGNOSIS — R457 State of emotional shock and stress, unspecified: Secondary | ICD-10-CM | POA: Insufficient documentation

## 2014-04-02 DIAGNOSIS — R0789 Other chest pain: Secondary | ICD-10-CM | POA: Diagnosis not present

## 2014-04-02 DIAGNOSIS — R1013 Epigastric pain: Secondary | ICD-10-CM

## 2014-04-02 DIAGNOSIS — Z880 Allergy status to penicillin: Secondary | ICD-10-CM | POA: Insufficient documentation

## 2014-04-02 DIAGNOSIS — N390 Urinary tract infection, site not specified: Secondary | ICD-10-CM

## 2014-04-02 DIAGNOSIS — Z21 Asymptomatic human immunodeficiency virus [HIV] infection status: Secondary | ICD-10-CM | POA: Insufficient documentation

## 2014-04-02 DIAGNOSIS — Z79899 Other long term (current) drug therapy: Secondary | ICD-10-CM | POA: Diagnosis not present

## 2014-04-02 DIAGNOSIS — M791 Myalgia: Secondary | ICD-10-CM | POA: Insufficient documentation

## 2014-04-02 LAB — URINALYSIS, ROUTINE W REFLEX MICROSCOPIC
BILIRUBIN URINE: NEGATIVE
Glucose, UA: NEGATIVE mg/dL
Hgb urine dipstick: NEGATIVE
KETONES UR: NEGATIVE mg/dL
NITRITE: POSITIVE — AB
PH: 7.5 (ref 5.0–8.0)
PROTEIN: NEGATIVE mg/dL
Specific Gravity, Urine: 1.02 (ref 1.005–1.030)
UROBILINOGEN UA: 0.2 mg/dL (ref 0.0–1.0)

## 2014-04-02 LAB — CBC WITH DIFFERENTIAL/PLATELET
Basophils Absolute: 0 10*3/uL (ref 0.0–0.1)
Basophils Relative: 1 % (ref 0–1)
Eosinophils Absolute: 0.2 10*3/uL (ref 0.0–0.7)
Eosinophils Relative: 3 % (ref 0–5)
HEMATOCRIT: 40.8 % (ref 36.0–46.0)
Hemoglobin: 12.9 g/dL (ref 12.0–15.0)
LYMPHS PCT: 41 % (ref 12–46)
Lymphs Abs: 2.3 10*3/uL (ref 0.7–4.0)
MCH: 25.8 pg — ABNORMAL LOW (ref 26.0–34.0)
MCHC: 31.6 g/dL (ref 30.0–36.0)
MCV: 81.6 fL (ref 78.0–100.0)
MONO ABS: 0.4 10*3/uL (ref 0.1–1.0)
MONOS PCT: 8 % (ref 3–12)
NEUTROS ABS: 2.8 10*3/uL (ref 1.7–7.7)
Neutrophils Relative %: 47 % (ref 43–77)
Platelets: 265 10*3/uL (ref 150–400)
RBC: 5 MIL/uL (ref 3.87–5.11)
RDW: 15 % (ref 11.5–15.5)
WBC: 5.8 10*3/uL (ref 4.0–10.5)

## 2014-04-02 LAB — COMPREHENSIVE METABOLIC PANEL
ALT: 10 U/L (ref 0–35)
ANION GAP: 11 (ref 5–15)
AST: 17 U/L (ref 0–37)
Albumin: 3.8 g/dL (ref 3.5–5.2)
Alkaline Phosphatase: 49 U/L (ref 39–117)
BUN: 10 mg/dL (ref 6–23)
CHLORIDE: 104 meq/L (ref 96–112)
CO2: 26 meq/L (ref 19–32)
Calcium: 9.4 mg/dL (ref 8.4–10.5)
Creatinine, Ser: 0.65 mg/dL (ref 0.50–1.10)
GLUCOSE: 92 mg/dL (ref 70–99)
Potassium: 3.9 mEq/L (ref 3.7–5.3)
Sodium: 141 mEq/L (ref 137–147)
Total Protein: 8 g/dL (ref 6.0–8.3)

## 2014-04-02 LAB — URINE MICROSCOPIC-ADD ON

## 2014-04-02 LAB — LIPASE, BLOOD: Lipase: 54 U/L (ref 11–59)

## 2014-04-02 LAB — TROPONIN I: Troponin I: <0.3 ng/mL (ref <0.30)

## 2014-04-02 MED ORDER — OLANZAPINE 5 MG PO TABS
5.0000 mg | ORAL_TABLET | Freq: Every day | ORAL | Status: DC
  Administered 2014-04-02: 5 mg via ORAL
  Filled 2014-04-02: qty 1

## 2014-04-02 MED ORDER — DICYCLOMINE HCL 20 MG PO TABS: 20.0000 mg | ORAL_TABLET | Freq: Two times a day (BID) | ORAL | Status: AC

## 2014-04-02 MED ORDER — QUETIAPINE FUMARATE 25 MG PO TABS: 25.0000 mg | ORAL_TABLET | Freq: Every day | ORAL | Status: DC

## 2014-04-02 MED ORDER — ELVITEG-COBIC-EMTRICIT-TENOFDF 150-150-200-300 MG PO TABS: 1.0000 | ORAL_TABLET | Freq: Every day | ORAL | Status: DC

## 2014-04-02 MED ORDER — NITROFURANTOIN MONOHYD MACRO 100 MG PO CAPS: 100.0000 mg | ORAL_CAPSULE | Freq: Two times a day (BID) | ORAL | Status: AC

## 2014-04-02 MED ORDER — QUETIAPINE FUMARATE 25 MG PO TABS
25.0000 mg | ORAL_TABLET | Freq: Every day | ORAL | Status: DC
  Administered 2014-04-02: 25 mg via ORAL
  Filled 2014-04-02: qty 1

## 2014-04-02 MED ORDER — OLANZAPINE 5 MG PO TABS: 5.0000 mg | ORAL_TABLET | Freq: Every day | ORAL | Status: AC

## 2014-04-02 NOTE — ED Notes (Signed)
Pt was given zyprexa and seroquel prior to discharge, per earlier conversation pt understood that she had to leave the premises after discharge on this visit. After talking to the patient at length, Charge RN agreed to allow patient to stay in the lobby until 6:30am. Off duty Officer aware of the plan.

## 2014-04-02 NOTE — ED Notes (Signed)
MD at bedside. 

## 2014-04-02 NOTE — ED Notes (Signed)
Pt c/o epigastric pain radiating to legs x 2 wks.  Pt very concerned with her cellphone.  Making sure it is plugged in at all times.  Plugged in in lobby and also immediately plugs phone in in triage room.

## 2014-04-02 NOTE — ED Provider Notes (Signed)
CSN: [id number]     Arrival date & time 04/02/14  2128 History   First MD Initiated Contact with Patient 04/02/14 2146     Chief Complaint  Patient presents with  . Homeless     HPI  Patient seen and evaluated few hours ago. Was reluctant to leave the hospital. Did so. Luckily for a few blocks. Called an ambulance. States she was stressed. States she had been off her medications and wished to be back on them. Returns here. Denies being homicidal suicidal.  Past Medical History  Diagnosis Date  . Bipolar 1 disorder   . HIV antibody positive    History reviewed. No pertinent past surgical history. History reviewed. No pertinent family history. History  Substance Use Topics  . Smoking status: Current Some Day Smoker  . Smokeless tobacco: Never Used  . Alcohol Use: No   OB History    No data available     Review of Systems  Constitutional: Negative for fever, chills, diaphoresis, appetite change and fatigue.  HENT: Negative for mouth sores, sore throat and trouble swallowing.   Eyes: Negative for visual disturbance.  Respiratory: Negative for cough, chest tightness, shortness of breath and wheezing.   Cardiovascular: Negative for chest pain.  Gastrointestinal: Negative for nausea, vomiting, abdominal pain, diarrhea and abdominal distention.  Endocrine: Negative for polydipsia, polyphagia and polyuria.  Genitourinary: Negative for dysuria, frequency and hematuria.  Musculoskeletal: Negative for gait problem.  Skin: Negative for color change, pallor and rash.  Neurological: Negative for dizziness, syncope, light-headedness and headaches.  Hematological: Does not bruise/bleed easily.  Psychiatric/Behavioral: Negative for behavioral problems and confusion.       That she feels "stressed".      Allergies  Penicillins  Home Medications   Prior to Admission medications   Medication Sig Start Date End Date Taking? Authorizing Provider  dicyclomine (BENTYL) 20 MG tablet Take  1 tablet (20 mg total) by mouth 2 (two) times daily. 04/02/14   Gilda Creasehristopher J. Pollina, MD  elvitegravir-cobicistat-emtricitabine-tenofovir (STRIBILD) 150-150-200-300 MG TABS tablet Take 1 tablet by mouth daily with breakfast.    Historical Provider, MD  elvitegravir-cobicistat-emtricitabine-tenofovir (STRIBILD) 150-150-200-300 MG TABS tablet Take 1 tablet by mouth daily with breakfast. 04/02/14   Rolland PorterMark Boneta Standre, MD  nitrofurantoin, macrocrystal-monohydrate, (MACROBID) 100 MG capsule Take 1 capsule (100 mg total) by mouth 2 (two) times daily with a meal. 03/26/14   Shuvon Rankin, NP  nitrofurantoin, macrocrystal-monohydrate, (MACROBID) 100 MG capsule Take 1 capsule (100 mg total) by mouth 2 (two) times daily. X 7 days 04/02/14   Gilda Creasehristopher J. Pollina, MD  OLANZapine (ZYPREXA) 5 MG tablet Take 1 tablet (5 mg total) by mouth at bedtime. 04/02/14   Rolland PorterMark Anzleigh Slaven, MD  OLANZapine zydis (ZYPREXA) 5 MG disintegrating tablet Take 1 tablet (5 mg total) by mouth 2 (two) times daily. 03/26/14   Shuvon Rankin, NP  QUEtiapine (SEROQUEL) 25 MG tablet Take 25 mg by mouth at bedtime.    Historical Provider, MD  QUEtiapine (SEROQUEL) 25 MG tablet Take 1 tablet (25 mg total) by mouth at bedtime. 04/02/14   Rolland PorterMark Kinisha Soper, MD   BP 163/86 mmHg  Pulse 85  Temp(Src) 98.4 F (36.9 C) (Oral)  Resp 20  SpO2 96%  LMP 02/23/2014 Physical Exam  Constitutional: She is oriented to person, place, and time. She appears well-developed and well-nourished. No distress.  HENT:  Head: Normocephalic.  Eyes: Conjunctivae are normal. Pupils are equal, round, and reactive to light. No scleral icterus.  Neck: Normal range of  motion. Neck supple. No thyromegaly present.  Cardiovascular: Normal rate and regular rhythm.  Exam reveals no gallop and no friction rub.   No murmur heard. Pulmonary/Chest: Effort normal and breath sounds normal. No respiratory distress. She has no wheezes. She has no rales.  Abdominal: Soft. Bowel sounds are normal. She  exhibits no distension. There is no tenderness. There is no rebound.  Musculoskeletal: Normal range of motion.  Neurological: She is alert and oriented to person, place, and time.  Skin: Skin is warm and dry. No rash noted.  Psychiatric: She has a normal mood and affect. Her behavior is normal.    ED Course  Procedures (including critical care time) Labs Review Labs Reviewed - No data to display  Imaging Review No results found.   EKG Interpretation None      MDM   Final diagnoses:  Homeless   Patient not homicidal suicidal. Awake alert clear. Simple requesting medications. Given a dose of Zyprexa and Seroquel. Refill prescriptions for the same. Encouraged primary care follow-up.    Rolland PorterMark Joelene Barriere, MD 04/02/14 2201

## 2014-04-02 NOTE — ED Notes (Signed)
Pt given sandwich and juice

## 2014-04-02 NOTE — ED Notes (Signed)
Pt was seen here earlier today and was discharged around 5pm  Pt was asked to leave the property and told if she came back that she would be arrested for trespassing  Pt went to [facility] and called EMS because she was angry and was off her medication  Pt is homeless because the person she was living with was evicted and she has no where to go  Pt denies SI/HI

## 2014-04-02 NOTE — ED Notes (Signed)
Pt states she has been off her meds for 2 mths and is feeling stressed and angry

## 2014-04-02 NOTE — ED Notes (Signed)
She is yelling at someone with whom she is having a phone conversation, and barely interrupts her conversation to speak to me.  She is agitated; and our charge nurse speaks with her in the presence of security staff.

## 2014-04-02 NOTE — ED Notes (Signed)
Pt refused to leave. Charge nurse and security made aware.

## 2014-04-02 NOTE — Discharge Instructions (Signed)
Abdominal Pain Many things can cause abdominal pain. Usually, abdominal pain is not caused by a disease and will improve without treatment. It can often be observed and treated at home. Your health care provider will do a physical exam and possibly order blood tests and X-rays to help determine the seriousness of your pain. However, in many cases, more time must pass before a clear cause of the pain can be found. Before that point, your health care provider may not know if you need more testing or further treatment. HOME CARE INSTRUCTIONS  Monitor your abdominal pain for any changes. The following actions may help to alleviate any discomfort you are experiencing:  Only take over-the-counter or prescription medicines as directed by your health care provider.  Do not take laxatives unless directed to do so by your health care provider.  Try a clear liquid diet (broth, tea, or water) as directed by your health care provider. Slowly move to a bland diet as tolerated. SEEK MEDICAL CARE IF:  You have unexplained abdominal pain.  You have abdominal pain associated with nausea or diarrhea.  You have pain when you urinate or have a bowel movement.  You experience abdominal pain that wakes you in the night.  You have abdominal pain that is worsened or improved by eating food.  You have abdominal pain that is worsened with eating fatty foods.  You have a fever. SEEK IMMEDIATE MEDICAL CARE IF:   Your pain does not go away within 2 hours.  You keep throwing up (vomiting).  Your pain is felt only in portions of the abdomen, such as the right side or the left lower portion of the abdomen.  You pass bloody or black tarry stools. MAKE SURE YOU:  Understand these instructions.   Will watch your condition.   Will get help right away if you are not doing well or get worse.  Document Released: 02/26/2005 Document Revised: 05/24/2013 Document Reviewed: 01/26/2013 [facility] Patient Information  2015 [place], [place]. This information is not intended to replace advice given to you by your health care provider. Make sure you discuss any questions you have with your health care provider.  Chest Pain (Nonspecific) It is often hard to give a diagnosis for the cause of chest pain. There is always a chance that your pain could be related to something serious, such as a heart attack or a blood clot in the lungs. You need to follow up with your doctor. HOME CARE  If antibiotic medicine was given, take it as directed by your doctor. Finish the medicine even if you start to feel better.  For the next few days, avoid activities that bring on chest pain. Continue physical activities as told by your doctor.  Do not use any tobacco products. This includes cigarettes, chewing tobacco, and e-cigarettes.  Avoid drinking alcohol.  Only take medicine as told by your doctor.  Follow your doctor's suggestions for more testing if your chest pain does not go away.  Keep all doctor visits you made. GET HELP IF:  Your chest pain does not go away, even after treatment.  You have a rash with blisters on your chest.  You have a fever. GET HELP RIGHT AWAY IF:   You have more pain or pain that spreads to your arm, neck, jaw, back, or belly (abdomen).  You have shortness of breath.  You cough more than usual or cough up blood.  You have very bad back or belly pain.  You feel sick  to your stomach (nauseous) or throw up (vomit).  You have very bad weakness.  You pass out (faint).  You have chills. This is an emergency. Do not wait to see if the problems will go away. Call your local emergency services (911 in [place] Do not drive yourself to the hospital. MAKE SURE YOU:   Understand these instructions.  Will watch your condition.  Will get help right away if you are not doing well or get worse. Document Released: 11/05/2007 Document Revised: 05/24/2013 Document Reviewed: 11/05/2007 [facility]ExitCare  Patient Information 2015 [place], [place]. This information is not intended to replace advice given to you by your health care provider. Make sure you discuss any questions you have with your health care provider.  Urinary Tract Infection Urinary tract infections (UTIs) can develop anywhere along your urinary tract. Your urinary tract is your body's drainage system for removing wastes and extra water. Your urinary tract includes two kidneys, two ureters, a bladder, and a urethra. Your kidneys are a pair of bean-shaped organs. Each kidney is about the size of your fist. They are located below your ribs, one on each side of your spine. CAUSES Infections are caused by microbes, which are microscopic organisms, including fungi, viruses, and bacteria. These organisms are so small that they can only be seen through a microscope. Bacteria are the microbes that most commonly cause UTIs. SYMPTOMS  Symptoms of UTIs may vary by age and gender of the patient and by the location of the infection. Symptoms in young women typically include a frequent and intense urge to urinate and a painful, burning feeling in the bladder or urethra during urination. Older women and men are more likely to be tired, shaky, and weak and have muscle aches and abdominal pain. A fever may mean the infection is in your kidneys. Other symptoms of a kidney infection include pain in your back or sides below the ribs, nausea, and vomiting. DIAGNOSIS To diagnose a UTI, your caregiver will ask you about your symptoms. Your caregiver also will ask to provide a urine sample. The urine sample will be tested for bacteria and white blood cells. White blood cells are made by your body to help fight infection. TREATMENT  Typically, UTIs can be treated with medication. Because most UTIs are caused by a bacterial infection, they usually can be treated with the use of antibiotics. The choice of antibiotic and length of treatment depend on your symptoms and  the type of bacteria causing your infection. HOME CARE INSTRUCTIONS  If you were prescribed antibiotics, take them exactly as your caregiver instructs you. Finish the medication even if you feel better after you have only taken some of the medication.  Drink enough water and fluids to keep your urine clear or pale yellow.  Avoid caffeine, tea, and carbonated beverages. They tend to irritate your bladder.  Empty your bladder often. Avoid holding urine for long periods of time.  Empty your bladder before and after sexual intercourse.  After a bowel movement, women should cleanse from front to back. Use each tissue only once. SEEK MEDICAL CARE IF:   You have back pain.  You develop a fever.  Your symptoms do not begin to resolve within 3 days. SEEK IMMEDIATE MEDICAL CARE IF:   You have severe back pain or lower abdominal pain.  You develop chills.  You have nausea or vomiting.  You have continued burning or discomfort with urination. MAKE SURE YOU:   Understand these instructions.  Will watch your  condition.  Will get help right away if you are not doing well or get worse. Document Released: 02/26/2005 Document Revised: 11/18/2011 Document Reviewed: 06/27/2011 [facility] Patient Information 2015 [place], [place]. This information is not intended to replace advice given to you by your health care provider. Make sure you discuss any questions you have with your health care provider.

## 2014-04-02 NOTE — ED Notes (Signed)
Pt states that she is unable to give urine sample at this time.  

## 2014-04-02 NOTE — ED Notes (Signed)
Per EMS-upper mid abdominal pain for over 2 weeks that radiates up and down right leg-called from parking lot-talking on phone while on stretcher and during EMS transport-no s/s's of distress

## 2014-04-02 NOTE — ED Provider Notes (Signed)
CSN: [id number]     Arrival date & time 04/02/14  1212 History   First MD Initiated Contact with Patient 04/02/14 1344     Chief Complaint  Patient presents with  . Abdominal Pain     (Consider location/radiation/quality/duration/timing/severity/associated sxs/prior Treatment) HPI Comments: Patient presents to the ER for evaluation of abdominal pain. Patient reports that she has been experiencing pain in the center of her abdomen which radiates up into the chest, down into both of her legs. Patient reports that the symptoms have been present for at least 2 weeks. She has not identified alleviating or exacerbating factors. Patient was recently seen in the ER and diagnosed with urinary tract infection. She reports that she has not taken antibiotics. Patient denies fever, nausea, vomiting, diarrhea.  Patient is a 53 y.o. female presenting with abdominal pain.  Abdominal Pain   Past Medical History  Diagnosis Date  . Bipolar 1 disorder   . HIV antibody positive    No past surgical history on file. No family history on file. History  Substance Use Topics  . Smoking status: Current Some Day Smoker  . Smokeless tobacco: Never Used  . Alcohol Use: No   OB History    No data available     Review of Systems  Gastrointestinal: Positive for abdominal pain.  Musculoskeletal: Positive for myalgias.  All other systems reviewed and are negative.     Allergies  Penicillins  Home Medications   Prior to Admission medications   Medication Sig Start Date End Date Taking? Authorizing Provider  elvitegravir-cobicistat-emtricitabine-tenofovir (STRIBILD) 150-150-200-300 MG TABS tablet Take 1 tablet by mouth daily with breakfast.   Yes Historical Provider, MD  OLANZapine zydis (ZYPREXA) 5 MG disintegrating tablet Take 1 tablet (5 mg total) by mouth 2 (two) times daily. 03/26/14  Yes Shuvon Rankin, NP  QUEtiapine (SEROQUEL) 25 MG tablet Take 25 mg by mouth at bedtime.   Yes Historical  Provider, MD  nitrofurantoin, macrocrystal-monohydrate, (MACROBID) 100 MG capsule Take 1 capsule (100 mg total) by mouth 2 (two) times daily with a meal. 03/26/14   Shuvon Rankin, NP   BP 145/99 mmHg  Pulse 77  Temp(Src) 98.6 F (37 C) (Oral)  Resp 15  Wt 126 lb (57.153 kg)  SpO2 99%  LMP 02/23/2014 Physical Exam  Constitutional: She is oriented to person, place, and time. She appears well-developed and well-nourished. No distress.  HENT:  Head: Normocephalic and atraumatic.  Right Ear: Hearing normal.  Left Ear: Hearing normal.  Nose: Nose normal.  Mouth/Throat: Oropharynx is clear and moist and mucous membranes are normal.  Eyes: Conjunctivae and EOM are normal. Pupils are equal, round, and reactive to light.  Neck: Normal range of motion. Neck supple.  Cardiovascular: Regular rhythm, S1 normal and S2 normal.  Exam reveals no gallop and no friction rub.   No murmur heard. Pulmonary/Chest: Effort normal and breath sounds normal. No respiratory distress. She exhibits no tenderness.  Abdominal: Soft. Normal appearance and bowel sounds are normal. There is no hepatosplenomegaly. There is no tenderness. There is no rebound, no guarding, no tenderness at McBurney's point and negative Murphy's sign. No hernia.  Musculoskeletal: Normal range of motion.  Neurological: She is alert and oriented to person, place, and time. She has normal strength. No cranial nerve deficit or sensory deficit. Coordination normal. GCS eye subscore is 4. GCS verbal subscore is 5. GCS motor subscore is 6.  Skin: Skin is warm, dry and intact. No rash noted. No cyanosis.  Psychiatric: She  has a normal mood and affect. Her speech is normal and behavior is normal. Thought content normal.    ED Course  Procedures (including critical care time) Labs Review Labs Reviewed  CBC WITH DIFFERENTIAL - Abnormal; Notable for the following:    MCH 25.8 (*)    All other components within normal limits  COMPREHENSIVE  METABOLIC PANEL - Abnormal; Notable for the following:    Total Bilirubin <0.2 (*)    All other components within normal limits  LIPASE, BLOOD  TROPONIN I  URINALYSIS, ROUTINE W REFLEX MICROSCOPIC    Imaging Review No results found.   EKG Interpretation   Date/Time:  Sunday April 02 2014 14:44:43 EST Ventricular Rate:  74 PR Interval:  170 QRS Duration: 90 QT Interval:  386 QTC Calculation: 428 R Axis:   90 Text Interpretation:  Normal sinus rhythm Rightward axis Moderate voltage  criteria for LVH, may be normal variant Borderline ECG No significant  change since last tracing Confirmed by Marguetta Windish  MD, Aliviya Schoeller [phone]([id number]) on  04/02/2014 3:19:53 PM      MDM   Final diagnoses:  None  abdominal pain Chest pain UTI  Presents to the ER for evaluation of primarily abdominal pain. Pain has been present for 2 weeks. Patient reports that the pain goes up into the upper abdomen and chest area at times. It also reportedly radiates down into her legs. This pattern of pain is of unclear etiology, does not seem to suggest a single etiology. Patient has normal vital signs.she is afebrile, no leukocytosis. Normal, perhaps a metabolic panel. Nontender in the right upper quadrant, no Murphy sign. Nontender at McBurney's point. EKG unchanged from previous, troponin negative.  Experiencing abdominal pain of unclear etiology, although she does still have a urinary tract infection. Patient's culture grew enterococcus previously. It was sensitive to [facility] which she was previously prescribed, but did not take. She will restart this antibiotic.    Gilda Creasehristopher J. Breelynn Bankert, MD 04/02/14 [phone]

## 2014-04-02 NOTE — Discharge Instructions (Signed)
Fill your medicines on Tuesday.  Follow-up with your primary care physician.

## 2014-04-03 MED ORDER — HYDROGEN PEROXIDE 3 % EX SOLN
CUTANEOUS | Status: AC
  Filled 2014-04-03: qty 473

## 2014-07-10 ENCOUNTER — Encounter (HOSPITAL_COMMUNITY): Payer: Self-pay | Admitting: Emergency Medicine

## 2014-07-10 ENCOUNTER — Emergency Department (HOSPITAL_COMMUNITY)
Admission: EM | Admit: 2014-07-10 | Discharge: 2014-07-11 | Disposition: A | Discharge status: Home / Self Care | Payer: Medicare Other | Attending: Emergency Medicine | Admitting: Emergency Medicine

## 2014-07-10 ENCOUNTER — Emergency Department (HOSPITAL_COMMUNITY): Payer: Medicare Other

## 2014-07-10 DIAGNOSIS — J069 Acute upper respiratory infection, unspecified: Secondary | ICD-10-CM | POA: Diagnosis not present

## 2014-07-10 DIAGNOSIS — J449 Chronic obstructive pulmonary disease, unspecified: Secondary | ICD-10-CM | POA: Diagnosis not present

## 2014-07-10 DIAGNOSIS — R05 Cough: Secondary | ICD-10-CM | POA: Diagnosis present

## 2014-07-10 DIAGNOSIS — Z21 Asymptomatic human immunodeficiency virus [HIV] infection status: Secondary | ICD-10-CM | POA: Insufficient documentation

## 2014-07-10 DIAGNOSIS — M791 Myalgia: Secondary | ICD-10-CM | POA: Diagnosis not present

## 2014-07-10 DIAGNOSIS — F319 Bipolar disorder, unspecified: Secondary | ICD-10-CM | POA: Diagnosis not present

## 2014-07-10 DIAGNOSIS — Z880 Allergy status to penicillin: Secondary | ICD-10-CM | POA: Insufficient documentation

## 2014-07-10 DIAGNOSIS — Z720 Tobacco use: Secondary | ICD-10-CM | POA: Diagnosis not present

## 2014-07-10 DIAGNOSIS — Z79899 Other long term (current) drug therapy: Secondary | ICD-10-CM | POA: Diagnosis not present

## 2014-07-10 DIAGNOSIS — J029 Acute pharyngitis, unspecified: Secondary | ICD-10-CM

## 2014-07-10 DIAGNOSIS — Z792 Long term (current) use of antibiotics: Secondary | ICD-10-CM | POA: Diagnosis not present

## 2014-07-10 DIAGNOSIS — R067 Sneezing: Secondary | ICD-10-CM

## 2014-07-10 HISTORY — DX: Chronic obstructive pulmonary disease, unspecified: J44.9

## 2014-07-10 LAB — RAPID STREP SCREEN (MED CTR MEBANE ONLY): STREPTOCOCCUS, GROUP A SCREEN (DIRECT): NEGATIVE

## 2014-07-10 MED ORDER — BENZOCAINE-PECTIN 15-5 MG MT LOZG: 1.0000 | LOZENGE | OROMUCOSAL | Status: AC | PRN

## 2014-07-10 NOTE — Discharge Instructions (Signed)
Upper Respiratory Infection, Adult An upper respiratory infection (URI) is also known as the common cold. It is often caused by a type of germ (virus). Colds are easily spread (contagious). You can pass it to others by kissing, coughing, sneezing, or drinking out of the same glass. Usually, you get better in 1 or 2 weeks.  HOME CARE   Only take medicine as told by your doctor.  Use a warm mist humidifier or breathe in steam from a hot shower.  Drink enough water and fluids to keep your pee (urine) clear or pale yellow.  Get plenty of rest.  Return to work when your temperature is back to normal or as told by your doctor. You may use a face mask and wash your hands to stop your cold from spreading. GET HELP RIGHT AWAY IF:   After the first few days, you feel you are getting worse.  You have questions about your medicine.  You have chills, shortness of breath, or brown or red spit (mucus).  You have yellow or brown snot (nasal discharge) or pain in the face, especially when you bend forward.  You have a fever, puffy (swollen) neck, pain when you swallow, or white spots in the back of your throat.  You have a bad headache, ear pain, sinus pain, or chest pain.  You have a high-pitched whistling sound when you breathe in and out (wheezing).  You have a lasting cough or cough up blood.  You have sore muscles or a stiff neck. MAKE SURE YOU:   Understand these instructions.  Will watch your condition.  Will get help right away if you are not doing well or get worse. Document Released: 11/05/2007 Document Revised: 08/11/2011 Document Reviewed: 08/24/2013 ExitCare Patient Information 2015 [employer]. This information is not intended to replace advice given to you by your health care provider. Make sure you discuss any questions you have with your health care provider.  

## 2014-07-10 NOTE — ED Provider Notes (Signed)
CSN: [id number]     Arrival date & time 07/10/14  1849 History   First MD Initiated Contact with Patient 07/10/14 2139     Chief Complaint  Patient presents with  . URI  . Cough     (Consider location/radiation/quality/duration/timing/severity/associated sxs/prior Treatment) Patient is a 54 y.o. female presenting with URI. The history is provided by the patient.  URI Presenting symptoms: fatigue and sore throat   Presenting symptoms: no congestion, no cough, no ear pain, no fever and no rhinorrhea   Severity:  Moderate Onset quality:  Gradual Duration:  3 weeks Timing:  Constant Progression:  Worsening Chronicity:  New Relieved by:  Nothing Worsened by:  Nothing tried Ineffective treatments:  None tried Associated symptoms: myalgias and sneezing   Associated symptoms: no headaches and no neck pain   Risk factors comment:  HIV  54 year old female with past mental history as below notable for HIV, COPD, bipolar who presents to ED complaining of body aches, sneezing, sore throat which is been ongoing for the past 3 weeks. Patient states symptoms have been progressively worsening. She states she has not attempted any over-the-counter remedies. She denies having any fevers, cough, nausea, vomiting, abdominal pain, neck pain, neck stiffness, urinary symptoms, chest pain, shortness of breath. Patient has not seen a primary doctor for this.   Past Medical History  Diagnosis Date  . Bipolar 1 disorder   . HIV antibody positive   . COPD (chronic obstructive pulmonary disease)    History reviewed. No pertinent past surgical history. History reviewed. No pertinent family history. History  Substance Use Topics  . Smoking status: Current Some Day Smoker  . Smokeless tobacco: Never Used  . Alcohol Use: No   OB History    No data available     Review of Systems  Constitutional: Positive for fatigue. Negative for fever and chills.  HENT: Positive for sneezing and sore throat.  Negative for congestion, ear discharge, ear pain, facial swelling, postnasal drip, rhinorrhea and sinus pressure.   Eyes: Negative for visual disturbance.  Respiratory: Negative for cough and shortness of breath.   Cardiovascular: Negative for chest pain, palpitations and leg swelling.  Gastrointestinal: Negative for nausea, vomiting, abdominal pain, diarrhea and constipation.  Genitourinary: Negative for dysuria, hematuria, vaginal bleeding and vaginal discharge.  Musculoskeletal: Positive for myalgias. Negative for back pain and neck pain.  Skin: Negative for rash.  Neurological: Negative for weakness and headaches.  All other systems reviewed and are negative.     Allergies  Penicillins  Home Medications   Prior to Admission medications   Medication Sig Start Date End Date Taking? Authorizing Provider  dicyclomine (BENTYL) 20 MG tablet Take 1 tablet (20 mg total) by mouth 2 (two) times daily. 04/02/14   Gilda Creasehristopher J. Pollina, MD  elvitegravir-cobicistat-emtricitabine-tenofovir (STRIBILD) 150-150-200-300 MG TABS tablet Take 1 tablet by mouth daily with breakfast.    Historical Provider, MD  elvitegravir-cobicistat-emtricitabine-tenofovir (STRIBILD) 150-150-200-300 MG TABS tablet Take 1 tablet by mouth daily with breakfast. 04/02/14   Rolland PorterMark James, MD  nitrofurantoin, macrocrystal-monohydrate, (MACROBID) 100 MG capsule Take 1 capsule (100 mg total) by mouth 2 (two) times daily with a meal. 03/26/14   Shuvon Rankin, NP  nitrofurantoin, macrocrystal-monohydrate, (MACROBID) 100 MG capsule Take 1 capsule (100 mg total) by mouth 2 (two) times daily. X 7 days 04/02/14   Gilda Creasehristopher J. Pollina, MD  OLANZapine (ZYPREXA) 5 MG tablet Take 1 tablet (5 mg total) by mouth at bedtime. 04/02/14   Rolland PorterMark James, MD  OLANZapine  zydis (ZYPREXA) 5 MG disintegrating tablet Take 1 tablet (5 mg total) by mouth 2 (two) times daily. 03/26/14   Shuvon Rankin, NP  QUEtiapine (SEROQUEL) 25 MG tablet Take 25 mg by mouth at  bedtime.    Historical Provider, MD  QUEtiapine (SEROQUEL) 25 MG tablet Take 1 tablet (25 mg total) by mouth at bedtime. 04/02/14   Rolland Porter, MD   BP 118/69 mmHg  Pulse 88  Temp(Src) 98.2 F (36.8 C) (Oral)  Resp 21  Ht  (1.626 m)  Wt 125 lb (56.7 kg)  BMI 21.45 kg/m2  SpO2 98%  LMP 06/09/2014 (Within Weeks) Physical Exam  Constitutional: She is oriented to person, place, and time. She appears well-developed and well-nourished. No distress.  HENT:  Head: Normocephalic and atraumatic.  Mouth/Throat: No oropharyngeal exudate.  Mild injection of post OP. Uvula midline. No signs of PTA.  Eyes: Conjunctivae are normal.  Neck: Normal range of motion.  Cardiovascular: Normal rate, regular rhythm, normal heart sounds and intact distal pulses.   No murmur heard. Pulmonary/Chest: Effort normal and breath sounds normal. No respiratory distress. She has no wheezes. She has no rales. She exhibits no tenderness.  Abdominal: Soft. Bowel sounds are normal. She exhibits no distension.  Musculoskeletal: Normal range of motion.  Lymphadenopathy:    She has no cervical adenopathy.  Neurological: She is alert and oriented to person, place, and time. No cranial nerve deficit.  Skin: Skin is warm and dry.  Psychiatric: She has a normal mood and affect.  Nursing note and vitals reviewed.   ED Course  Procedures (including critical care time) Labs Review Labs Reviewed  RAPID STREP SCREEN  CULTURE, GROUP A STREP    Imaging Review Dg Chest 2 View (if Patient Has Fever And/or Copd)  07/10/2014   CLINICAL DATA:  Dry cough. Sneezing for 1 week. Fever for 1 day. HIV. Bipolar disorder. Initial encounter.  EXAM: CHEST  2 VIEW  COMPARISON:  None.  FINDINGS: Emphysema is present with flattening of the hemidiaphragms. The cardiopericardial silhouette and mediastinal contours are within normal limits. There is no airspace disease. No pleural effusion. Thoracic spondylosis is present.  IMPRESSION:  Emphysema without acute cardiopulmonary disease.   Electronically Signed   By: Andreas Newport M.D.   On: 07/10/2014 19:33     EKG Interpretation None      MDM   Final diagnoses:  None   Natalie Barrett is a 54 y.o. female with H&P as above. HDS, NAD. -Pertinent historical findings: URI sxs. Per Care Everywhere, on 05/22/14, pt had CD4 count 790 at [facility]. -Initial impression: well appearing. benign exam. No signs of dehydration. No signs of esophagitis. No neck stiffness or meningeal signs.  -Ordered: strep screen, CXR -Results: neg -Re-evaluation: remains well appearing. -Disposition: Likley URI. d/c home. Clinical Impression: 1. Sneezing   2. URI (upper respiratory infection)   3. Sore throat     Disposition: Discharge  Condition: Good  I have discussed the results, Dx and Tx plan with the pt(& family if present). He/she/they expressed understanding and agree(s) with the plan. Discharge instructions discussed at great length. Strict return precautions discussed and pt &/or family have verbalized understanding of the instructions. No further questions at time of discharge.    New Prescriptions   No medications on file    Follow Up: [facility]     [address] [place] [address] [phone]  To establish primary care, call above.  [facility] [address] [id number] mc [place] [address] [phone]  If symptoms worsen  [facility]              [address] [place] [address]   To establish infectious disease care, call above   Pt seen in conjunction with Dr. Glynn Octave, MD  Ames Dura, DO [facility] Emergency Medicine Resident - PGY-2        Ames Dura, MD 07/10/14 6295  Glynn Octave, MD 07/11/14 [phone]

## 2014-07-10 NOTE — ED Notes (Signed)
Pt c/o URI sx with cough and body aches; pt sts nasal congestion

## 2014-07-12 ENCOUNTER — Emergency Department (HOSPITAL_COMMUNITY)
Admission: EM | Admit: 2014-07-12 | Discharge: 2014-07-12 | Disposition: A | Discharge status: Home / Self Care | Payer: Medicare Other | Attending: Emergency Medicine | Admitting: Emergency Medicine

## 2014-07-12 ENCOUNTER — Encounter (HOSPITAL_COMMUNITY): Payer: Self-pay | Admitting: Emergency Medicine

## 2014-07-12 DIAGNOSIS — R05 Cough: Secondary | ICD-10-CM

## 2014-07-12 DIAGNOSIS — J449 Chronic obstructive pulmonary disease, unspecified: Secondary | ICD-10-CM | POA: Diagnosis not present

## 2014-07-12 DIAGNOSIS — Z21 Asymptomatic human immunodeficiency virus [HIV] infection status: Secondary | ICD-10-CM | POA: Diagnosis not present

## 2014-07-12 DIAGNOSIS — Z720 Tobacco use: Secondary | ICD-10-CM | POA: Insufficient documentation

## 2014-07-12 DIAGNOSIS — Z880 Allergy status to penicillin: Secondary | ICD-10-CM | POA: Diagnosis not present

## 2014-07-12 DIAGNOSIS — F319 Bipolar disorder, unspecified: Secondary | ICD-10-CM | POA: Insufficient documentation

## 2014-07-12 DIAGNOSIS — Z79899 Other long term (current) drug therapy: Secondary | ICD-10-CM | POA: Diagnosis not present

## 2014-07-12 DIAGNOSIS — J40 Bronchitis, not specified as acute or chronic: Secondary | ICD-10-CM

## 2014-07-12 DIAGNOSIS — R059 Cough, unspecified: Secondary | ICD-10-CM

## 2014-07-12 HISTORY — DX: Unspecified asthma, uncomplicated: J45.909

## 2014-07-12 LAB — CBC
HEMATOCRIT: 35.8 % — AB (ref 36.0–46.0)
Hemoglobin: 11.5 g/dL — ABNORMAL LOW (ref 12.0–15.0)
MCH: 26.8 pg (ref 26.0–34.0)
MCHC: 32.1 g/dL (ref 30.0–36.0)
MCV: 83.4 fL (ref 78.0–100.0)
Platelets: 233 10*3/uL (ref 150–400)
RBC: 4.29 MIL/uL (ref 3.87–5.11)
RDW: 14.3 % (ref 11.5–15.5)
WBC: 5.5 10*3/uL (ref 4.0–10.5)

## 2014-07-12 LAB — BASIC METABOLIC PANEL
ANION GAP: 4 — AB (ref 5–15)
BUN: 13 mg/dL (ref 6–23)
CHLORIDE: 107 mmol/L (ref 96–112)
CO2: 27 mmol/L (ref 19–32)
CREATININE: 0.61 mg/dL (ref 0.50–1.10)
Calcium: 8.6 mg/dL (ref 8.4–10.5)
GFR calc non Af Amer: >90 mL/min (ref >90)
GLUCOSE: 95 mg/dL (ref 70–99)
POTASSIUM: 3.5 mmol/L (ref 3.5–5.1)
Sodium: 138 mmol/L (ref 135–145)

## 2014-07-12 LAB — CULTURE, GROUP A STREP

## 2014-07-12 MED ORDER — BENZONATATE 100 MG PO CAPS
200.0000 mg | ORAL_CAPSULE | ORAL | Status: AC
  Administered 2014-07-12: 200 mg via ORAL
  Filled 2014-07-12: qty 2

## 2014-07-12 MED ORDER — ALBUTEROL SULFATE HFA 108 (90 BASE) MCG/ACT IN AERS
2.0000 | INHALATION_SPRAY | RESPIRATORY_TRACT | Status: AC
  Administered 2014-07-12: 2 via RESPIRATORY_TRACT
  Filled 2014-07-12: qty 6.7

## 2014-07-12 MED ORDER — BENZONATATE 100 MG PO CAPS: 100.0000 mg | ORAL_CAPSULE | Freq: Three times a day (TID) | ORAL | Status: DC

## 2014-07-12 NOTE — ED Provider Notes (Signed)
CSN: [id number]     Arrival date & time 07/12/14  0909 History   First MD Initiated Contact with Patient 07/12/14 [id number]     Chief Complaint  Patient presents with  . cold like symptoms     . Cough  . Generalized Body Aches  . Headache     (Consider location/radiation/quality/duration/timing/severity/associated sxs/prior Treatment) HPI Comments: The patient is a 54 year old female, she presents to the hospital with a complaint of a cough. She was seen yesterday for the same symptoms at which time she had a normal chest x-ray and EKG was unremarkable. She reports nasal congestion, sneezing, sore throat, itchy throat, mild body aches. The cough is nonproductive, and not associated with diarrhea dysuria or swelling of the legs. She does have HIV, she takes her HIV medications, she states that she is doing well with that. Her friend who accompanies her states that she was having off-and-on shaking last night, this happened about 6 times, the patient was awake and alert during these episodes, there was no signs of loss of consciousness, no foaming at the mouth, no tongue biting, no urinary incontinence. The patient denies headache, denies history of seizures. Her last CD4 count was close to 800 in December.  Patient is a 54 y.o. female presenting with cough and headaches. The history is provided by the patient.  Cough Associated symptoms: headaches   Headache Associated symptoms: cough     Past Medical History  Diagnosis Date  . Bipolar 1 disorder   . HIV antibody positive   . COPD (chronic obstructive pulmonary disease)   . Asthma    Past Surgical History  Procedure Laterality Date  . Cesarean section     No family history on file. History  Substance Use Topics  . Smoking status: Current Some Day Smoker    Types: Cigarettes  . Smokeless tobacco: Never Used  . Alcohol Use: No   OB History    No data available     Review of Systems  Respiratory: Positive for cough.    Neurological: Positive for headaches.  All other systems reviewed and are negative.     Allergies  Penicillins  Home Medications   Prior to Admission medications   Medication Sig Start Date End Date Taking? Authorizing Provider  elvitegravir-cobicistat-emtricitabine-tenofovir (STRIBILD) 150-150-200-300 MG TABS tablet Take 1 tablet by mouth daily with breakfast. 04/02/14  Yes Rolland PorterMark James, MD  QUEtiapine (SEROQUEL) 25 MG tablet Take 1 tablet (25 mg total) by mouth at bedtime. 04/02/14  Yes Rolland PorterMark James, MD  Benzocaine-Pectin 15-5 MG LOZG Use as directed 1 lozenge in the mouth or throat every 2 (two) hours as needed. Patient not taking: Reported on 07/12/2014 07/10/14   Ames DuraStephen Balleh, MD  dicyclomine (BENTYL) 20 MG tablet Take 1 tablet (20 mg total) by mouth 2 (two) times daily. Patient not taking: Reported on 07/12/2014 04/02/14   Gilda Creasehristopher J. Pollina, MD  nitrofurantoin, macrocrystal-monohydrate, (MACROBID) 100 MG capsule Take 1 capsule (100 mg total) by mouth 2 (two) times daily with a meal. Patient not taking: Reported on 07/12/2014 03/26/14   Shuvon Rankin, NP  nitrofurantoin, macrocrystal-monohydrate, (MACROBID) 100 MG capsule Take 1 capsule (100 mg total) by mouth 2 (two) times daily. X 7 days Patient not taking: Reported on 07/12/2014 04/02/14   Gilda Creasehristopher J. Pollina, MD  OLANZapine (ZYPREXA) 5 MG tablet Take 1 tablet (5 mg total) by mouth at bedtime. Patient not taking: Reported on 07/12/2014 04/02/14   Rolland PorterMark James, MD  OLANZapine zydis (ZYPREXA) 5 MG  disintegrating tablet Take 1 tablet (5 mg total) by mouth 2 (two) times daily. Patient not taking: Reported on 07/12/2014 03/26/14   Shuvon Rankin, NP   BP 107/63 mmHg  Pulse 90  Temp(Src) 98.3 F (36.8 C) (Oral)  Resp 20  SpO2 95%  LMP 06/09/2014 (Within Weeks) Physical Exam  Constitutional: She appears well-developed and well-nourished. No distress.  HENT:  Head: Normocephalic and atraumatic.  Mouth/Throat: Oropharynx is clear and  moist. No oropharyngeal exudate.  Normocephalic, atraumatic, clear oropharynx without any swelling, exudate, asymmetry, hypertrophy or erythema.  Moist mucous membranes, clear nasal passages, normal tympanic membranes without any effusions, erythema or opacification.  Eyes: Conjunctivae and EOM are normal. Pupils are equal, round, and reactive to light. Right eye exhibits no discharge. Left eye exhibits no discharge. No scleral icterus.  Neck: Normal range of motion. Neck supple. No JVD present. No thyromegaly present.  Cardiovascular: Normal rate, regular rhythm, normal heart sounds and intact distal pulses.  Exam reveals no gallop and no friction rub.   No murmur heard. Pulmonary/Chest: Effort normal and breath sounds normal. No respiratory distress. She has no wheezes. She has no rales.  Lungs sounds are clear to auscultation bilaterally, no wheezes rhonchi or rales, no increased work of breathing, speaks in full sentences, no cyanosis, no accessory muscle use.  Abdominal: Soft. Bowel sounds are normal. She exhibits no distension and no mass. There is no tenderness.  Musculoskeletal: Normal range of motion. She exhibits no edema or tenderness.  Lymphadenopathy:    She has no cervical adenopathy.  Neurological: She is alert. Coordination normal.  Skin: Skin is warm and dry. No rash noted. No erythema.  Psychiatric: She has a normal mood and affect. Her behavior is normal.  Nursing note and vitals reviewed.   ED Course  Procedures (including critical care time) Labs Review Labs Reviewed  BASIC METABOLIC PANEL - Abnormal; Notable for the following:    Anion gap 4 (*)    All other components within normal limits  CBC - Abnormal; Notable for the following:    Hemoglobin 11.5 (*)    HCT 35.8 (*)    All other components within normal limits    Imaging Review Dg Chest 2 View (if Patient Has Fever And/or Copd)  07/10/2014   CLINICAL DATA:  Dry cough. Sneezing for 1 week. Fever for 1 day.  HIV. Bipolar disorder. Initial encounter.  EXAM: CHEST  2 VIEW  COMPARISON:  None.  FINDINGS: Emphysema is present with flattening of the hemidiaphragms. The cardiopericardial silhouette and mediastinal contours are within normal limits. There is no airspace disease. No pleural effusion. Thoracic spondylosis is present.  IMPRESSION: Emphysema without acute cardiopulmonary disease.   Electronically Signed   By: Andreas Newport M.D.   On: 07/10/2014 19:33      MDM   Final diagnoses:  Cough  Bronchitis    The patient appears well, vital signs are normal, chest x-ray reviewed from yesterday, see report above. The patient will be given albuterol MDI, Tessalon Perles for the cough, reassurance, check labs to ensure no significant left foot abnormalities that might cause tremor, consider hypokalemia, hyponatremia.  Labs normal,  Meds given in ED:  Medications  benzonatate (TESSALON) capsule 200 mg (200 mg Oral Given 07/12/14 0936)  albuterol (PROVENTIL HFA;VENTOLIN HFA) 108 (90 BASE) MCG/ACT inhaler 2 puff (2 puffs Inhalation Given 07/12/14 0936)    New Prescriptions   No medications on file      Vida Roller, MD 07/12/14 1028

## 2014-07-12 NOTE — ED Notes (Signed)
Per EMS [place] cold like symptoms x 2 week, dry cough and sweats at night.  Pt has HIV which pt states that she is taking regularly. Per friend with pt, he told EMS that pt has shaking then stiffness last night about 6 times that followed with confusion, but they were at the shelter and stated they were afraid to call EMS pt they would be kicked out. Pt has no PMH of seizures.

## 2014-07-12 NOTE — Progress Notes (Signed)
Pt with request at d/c to get Rx from EDP After Rx given pt informed ED RN she could not afford Rx and she wanted to speak with a CM or SW but left prior to being seen by either with female friend who was with her [place] Pt with Medicare coverage therefore this pt is NOT [facility] for medication assistance.

## 2014-07-12 NOTE — ED Notes (Signed)
Bed: WA16 Expected date:  Expected time:  Means of arrival:  Comments: Ems seizure 

## 2014-07-12 NOTE — ED Notes (Signed)
Pt and family requesting a bus pass.

## 2014-07-12 NOTE — Discharge Instructions (Signed)
Your xray and blood work ar normal - use the inhaler 2 puffs every 4 hours as needed for difficulty breathing  Please call your doctor for a followup appointment within 24-48 hours. When you talk to your doctor please let them know that you were seen in the emergency department and have them acquire all of your records so that they can discuss the findings with you and formulate a treatment plan to fully care for your new and ongoing problems.

## 2014-07-12 NOTE — ED Notes (Signed)
Gave pt apple juice and ham sandwich. Gave pt's visitor ginger ale.

## 2014-07-12 NOTE — ED Notes (Signed)
Pt was waiting for consult with social work to see if she could have prescription medications covered, pt left before seen by social work and d/c.

## 2014-07-18 ENCOUNTER — Encounter (HOSPITAL_COMMUNITY): Payer: Self-pay | Admitting: Emergency Medicine

## 2014-07-18 ENCOUNTER — Emergency Department (HOSPITAL_COMMUNITY): Payer: Medicare Other

## 2014-07-18 ENCOUNTER — Emergency Department (HOSPITAL_COMMUNITY)
Admission: EM | Admit: 2014-07-18 | Discharge: 2014-07-19 | Discharge status: Against Medical Advice | Payer: Medicare Other | Attending: Emergency Medicine | Admitting: Emergency Medicine

## 2014-07-18 DIAGNOSIS — M791 Myalgia: Secondary | ICD-10-CM | POA: Diagnosis not present

## 2014-07-18 DIAGNOSIS — F319 Bipolar disorder, unspecified: Secondary | ICD-10-CM | POA: Diagnosis not present

## 2014-07-18 DIAGNOSIS — Z21 Asymptomatic human immunodeficiency virus [HIV] infection status: Secondary | ICD-10-CM | POA: Insufficient documentation

## 2014-07-18 DIAGNOSIS — Z880 Allergy status to penicillin: Secondary | ICD-10-CM | POA: Diagnosis not present

## 2014-07-18 DIAGNOSIS — Z720 Tobacco use: Secondary | ICD-10-CM | POA: Diagnosis not present

## 2014-07-18 DIAGNOSIS — Z79899 Other long term (current) drug therapy: Secondary | ICD-10-CM | POA: Insufficient documentation

## 2014-07-18 DIAGNOSIS — F911 Conduct disorder, childhood-onset type: Secondary | ICD-10-CM | POA: Diagnosis not present

## 2014-07-18 DIAGNOSIS — R000 Tachycardia, unspecified: Secondary | ICD-10-CM | POA: Diagnosis not present

## 2014-07-18 DIAGNOSIS — R05 Cough: Secondary | ICD-10-CM

## 2014-07-18 DIAGNOSIS — R059 Cough, unspecified: Secondary | ICD-10-CM

## 2014-07-18 DIAGNOSIS — J449 Chronic obstructive pulmonary disease, unspecified: Secondary | ICD-10-CM | POA: Insufficient documentation

## 2014-07-18 MED ORDER — IPRATROPIUM-ALBUTEROL 0.5-2.5 (3) MG/3ML IN SOLN
3.0000 mL | Freq: Once | RESPIRATORY_TRACT | Status: DC
  Filled 2014-07-18: qty 3

## 2014-07-18 MED ORDER — PREDNISONE 20 MG PO TABS
60.0000 mg | ORAL_TABLET | Freq: Once | ORAL | Status: AC
  Administered 2014-07-18: 60 mg via ORAL
  Filled 2014-07-18: qty 3

## 2014-07-18 MED ORDER — HYDROCOD POLST-CHLORPHEN POLST 10-8 MG/5ML PO LQCR: 5.0000 mL | Freq: Once | ORAL | Status: DC

## 2014-07-18 NOTE — ED Notes (Addendum)
Pt. reports chronic persistent  non productive cough for several months with chest congestion and sore throat unrelieved by OTC cough medications . Denies fever or chills , seen here 6 days ago diagnosed with bronchitis.

## 2014-07-18 NOTE — ED Provider Notes (Signed)
CSN: [id number]     Arrival date & time 07/18/14  2220 History  This chart was scribed for Natalie DredgeEmily Zala Degrasse, PA-C working with Audree CamelScott T Goldston, MD by Evon Slackerrance Branch, ED Scribe. This patient was seen in room TR08C/TR08C and the patient's care was started at 10:36 PM.      Chief Complaint  Patient presents with  . Cough   Patient is a 54 y.o. female presenting with cough. The history is provided by the patient. No language interpreter was used.  Cough Associated symptoms: chest pain (brought on from coughing ), myalgias and sore throat   Associated symptoms: no chills, no ear pain, no fever, no shortness of breath and no wheezing    HPI Comments: Natalie Barrett is a 54 y.o. female with PMHx of COPD and asthma  who presents to the Emergency Department complaining of persistent non productive cough for several months. Pt states she has also had a sore throat. Pt states she has associated rib pain and generalized myalgias when coughing. Pt states she has tried tessalon pearls with no relief. Pt states she has tried mucinex as well with no relief. Pt states she has been trying several other OTC medications with no relief.  Pt denies SOB, congestion, ear pain or other related symptoms.   Pt has been seen in the ED previously 2/8 and 2/10 with negative workup.   Pt states she is a current everyday smoker.  Pt has hx HIV, most recent CD4 count on record just under 800.      Past Medical History  Diagnosis Date  . Bipolar 1 disorder   . HIV antibody positive   . COPD (chronic obstructive pulmonary disease)   . Asthma    Past Surgical History  Procedure Laterality Date  . Cesarean section     No family history on file. History  Substance Use Topics  . Smoking status: Current Some Day Smoker    Types: Cigarettes  . Smokeless tobacco: Never Used  . Alcohol Use: No   OB History    No data available      Review of Systems  Constitutional: Negative for fever and chills.  HENT: Positive for  sore throat. Negative for congestion and ear pain.   Respiratory: Positive for cough. Negative for shortness of breath, wheezing and stridor.   Cardiovascular: Positive for chest pain (brought on from coughing ).  Gastrointestinal: Negative for abdominal pain.  Musculoskeletal: Positive for myalgias.  Allergic/Immunologic: Positive for immunocompromised state.     Allergies  Penicillins  Home Medications   Prior to Admission medications   Medication Sig Start Date End Date Taking? Authorizing Provider  Benzocaine-Pectin 15-5 MG LOZG Use as directed 1 lozenge in the mouth or throat every 2 (two) hours as needed. Patient not taking: Reported on 07/12/2014 07/10/14   Ames DuraStephen Balleh, MD  benzonatate (TESSALON) 100 MG capsule Take 1 capsule (100 mg total) by mouth every 8 (eight) hours. 07/12/14   Vida RollerBrian D Miller, MD  dicyclomine (BENTYL) 20 MG tablet Take 1 tablet (20 mg total) by mouth 2 (two) times daily. Patient not taking: Reported on 07/12/2014 04/02/14   Gilda Creasehristopher J. Pollina, MD  elvitegravir-cobicistat-emtricitabine-tenofovir (STRIBILD) 150-150-200-300 MG TABS tablet Take 1 tablet by mouth daily with breakfast. 04/02/14   Rolland PorterMark James, MD  nitrofurantoin, macrocrystal-monohydrate, (MACROBID) 100 MG capsule Take 1 capsule (100 mg total) by mouth 2 (two) times daily with a meal. Patient not taking: Reported on 07/12/2014 03/26/14   Shuvon Rankin, NP  nitrofurantoin,  macrocrystal-monohydrate, (MACROBID) 100 MG capsule Take 1 capsule (100 mg total) by mouth 2 (two) times daily. X 7 days Patient not taking: Reported on 07/12/2014 04/02/14   Gilda Crease, MD  OLANZapine (ZYPREXA) 5 MG tablet Take 1 tablet (5 mg total) by mouth at bedtime. Patient not taking: Reported on 07/12/2014 04/02/14   Rolland Porter, MD  OLANZapine zydis (ZYPREXA) 5 MG disintegrating tablet Take 1 tablet (5 mg total) by mouth 2 (two) times daily. Patient not taking: Reported on 07/12/2014 03/26/14   Shuvon Rankin, NP   QUEtiapine (SEROQUEL) 25 MG tablet Take 1 tablet (25 mg total) by mouth at bedtime. 04/02/14   Rolland Porter, MD   BP 137/76 mmHg  Pulse 98  Temp(Src) 99 F (37.2 C) (Oral)  Resp 18  Ht  (1.626 m)  Wt 130 lb (58.968 kg)  BMI 22.30 kg/m2  SpO2 100%  LMP 06/09/2014   Physical Exam  Constitutional: She appears well-developed and well-nourished. No distress.  HENT:  Head: Normocephalic and atraumatic.  Neck: Neck supple.  Cardiovascular: Tachycardia present.   Pulmonary/Chest: Effort normal.  Paroxysmal cough with any deep breathing.   Musculoskeletal: She exhibits no edema.  Neurological: She is alert.  Skin: She is not diaphoretic.  Psychiatric: Her affect is angry. She is aggressive.  Nursing note and vitals reviewed.   ED Course  Procedures (including critical care time) DIAGNOSTIC STUDIES: Oxygen Saturation is 100% on RA, normal by my interpretation.    COORDINATION OF CARE: 10:47 PM-Discussed treatment plan with pt at bedside and pt agreed to plan.     Labs Review Labs Reviewed - No data to display  Imaging Review Dg Chest 2 View  07/18/2014   CLINICAL DATA:  Cough and body aches for 2 weeks  EXAM: CHEST  2 VIEW  COMPARISON:  07/10/2014  FINDINGS: There is moderate hyperinflation. This is unchanged. No superimposed acute infiltrate or congestive failure is evident. There are no pleural effusions. Hilar and mediastinal contours are unremarkable and unchanged.  IMPRESSION: Hyperinflation consistent with COPD. No acute cardiopulmonary findings.   Electronically Signed   By: Ellery Plunk M.D.   On: 07/18/2014 23:17     EKG Interpretation None        Discussed with pharmacist - bactrim or doxycyline would be acceptable given patient's home medication of Stribild.    MDM   Final diagnoses:  Cough  Chronic obstructive pulmonary disease, unspecified COPD, unspecified chronic bronchitis type   Afebrile, nontoxic patient with hx COPD and well controlled HIV  with dry cough, right sided chest soreness with coughing.  CXR negative. I ordered a duoneb treatment, prednisone, tussionex for this patient.  Pt became irate when the nurse attempted to give the patient prednisone and started complaining that the mucinex and tessalon perles she had taken didn't help and we didn't understand.  The nurse and I both attempted to explain to the patient about all of the medications I had ordered for her but she was very angry and was not receptive to our discussion.  Her significant other also attempted to explain to her that these were new and different medications and encouraged her to try them and she refused.  I was on the phone with the pharmacist attempting to find an antibiotic that pt could take for a COPD exacerbation that would not interact with her HIV medication when the significant other walked out, followed closely by the patient herself.  CXR negative.  Pt stormed out without any paperwork  and before I had written prescriptions or discharge instructions for the patient.    I personally performed the services described in this documentation, which was scribed in my presence. The recorded information has been reviewed and is accurate.      Natalie Dredge, PA-C 07/19/14 0025  Audree Camel, MD 07/25/14 [phone]

## 2014-07-19 NOTE — ED Notes (Signed)
Pt started yelling when given the prednisone, stating that this is not what she was given last time.  Then pt st's that what she was given last time did not help.  Tried to reason with pt and explain that this was a steroid pt continued to yell.  Trixie Dredgemily West in to explain to pt the plan of care, pt began yelling at her.  [employer] outside of room.  Pt walked out of ED

## 2014-07-20 ENCOUNTER — Encounter (HOSPITAL_COMMUNITY): Payer: Self-pay | Admitting: Emergency Medicine

## 2014-07-20 ENCOUNTER — Emergency Department (HOSPITAL_COMMUNITY)
Admission: EM | Admit: 2014-07-20 | Discharge: 2014-07-20 | Disposition: A | Discharge status: Home / Self Care | Payer: Medicare Other | Attending: Emergency Medicine | Admitting: Emergency Medicine

## 2014-07-20 ENCOUNTER — Emergency Department (HOSPITAL_COMMUNITY)
Admission: EM | Admit: 2014-07-20 | Discharge: 2014-07-20 | Disposition: A | Discharge status: Home / Self Care | Payer: Medicare Other | Source: Home / Self Care | Attending: Emergency Medicine | Admitting: Emergency Medicine

## 2014-07-20 ENCOUNTER — Encounter (HOSPITAL_COMMUNITY): Payer: Self-pay

## 2014-07-20 DIAGNOSIS — F912 Conduct disorder, adolescent-onset type: Secondary | ICD-10-CM | POA: Insufficient documentation

## 2014-07-20 DIAGNOSIS — R079 Chest pain, unspecified: Secondary | ICD-10-CM

## 2014-07-20 DIAGNOSIS — R05 Cough: Secondary | ICD-10-CM

## 2014-07-20 DIAGNOSIS — Z79899 Other long term (current) drug therapy: Secondary | ICD-10-CM | POA: Insufficient documentation

## 2014-07-20 DIAGNOSIS — R059 Cough, unspecified: Secondary | ICD-10-CM

## 2014-07-20 DIAGNOSIS — R000 Tachycardia, unspecified: Secondary | ICD-10-CM

## 2014-07-20 DIAGNOSIS — J449 Chronic obstructive pulmonary disease, unspecified: Secondary | ICD-10-CM

## 2014-07-20 DIAGNOSIS — Z720 Tobacco use: Secondary | ICD-10-CM | POA: Insufficient documentation

## 2014-07-20 DIAGNOSIS — Z21 Asymptomatic human immunodeficiency virus [HIV] infection status: Secondary | ICD-10-CM

## 2014-07-20 DIAGNOSIS — F319 Bipolar disorder, unspecified: Secondary | ICD-10-CM | POA: Insufficient documentation

## 2014-07-20 DIAGNOSIS — Z880 Allergy status to penicillin: Secondary | ICD-10-CM | POA: Insufficient documentation

## 2014-07-20 DIAGNOSIS — M791 Myalgia: Secondary | ICD-10-CM | POA: Insufficient documentation

## 2014-07-20 DIAGNOSIS — R5383 Other fatigue: Secondary | ICD-10-CM | POA: Insufficient documentation

## 2014-07-20 MED ORDER — PREDNISONE 20 MG PO TABS: 40.0000 mg | ORAL_TABLET | Freq: Every day | ORAL | Status: AC

## 2014-07-20 MED ORDER — ALBUTEROL SULFATE (2.5 MG/3ML) 0.083% IN NEBU
5.0000 mg | INHALATION_SOLUTION | Freq: Once | RESPIRATORY_TRACT | Status: AC
  Administered 2014-07-20: 5 mg via RESPIRATORY_TRACT
  Filled 2014-07-20: qty 6

## 2014-07-20 MED ORDER — IPRATROPIUM BROMIDE 0.02 % IN SOLN
0.5000 mg | Freq: Once | RESPIRATORY_TRACT | Status: AC
  Administered 2014-07-20: 0.5 mg via RESPIRATORY_TRACT
  Filled 2014-07-20: qty 2.5

## 2014-07-20 MED ORDER — GUAIFENESIN 100 MG/5ML PO LIQD: 100.0000 mg | ORAL | Status: DC | PRN

## 2014-07-20 NOTE — ED Provider Notes (Signed)
CSN: [id number]     Arrival date & time 07/20/14  1617 History  This chart was scribed for non-physician practitioner, Kathrynn Speedobyn M Thurl Boen, PA-C, working with Donnetta HutchingBrian Cook, MD, by Ronney LionSuzanne Le, ED Scribe. This patient was seen in room WTR5/WTR5 and the patient's care was started at 4:31 PM.    Chief Complaint  Patient presents with  . Cough   The history is provided by the patient. No language interpreter was used.    HPI Comments: Natalie NumberSandra Barrett is a 54 y.o. female with a history of COPD and asthma who presents to the Emergency Department complaining of a constant cough ongoing for the past 6 months. She complains of associated pain on her right lateral side when coughing. She was seen here 8 days ago and 2 days ago for the same symptoms; she also had a negative CXR 2 days ago. Patient has taken inhalers at home with no relief. She reports that there was an "infection" on her lungs in [place] when she was first diagnosed with COPD, and would like to be tested for cancer here.  Her PCP is in [place], and she [place]'t have a [place]CP in [place] despite living here. Pt poor historian, very aggressive and agitated.  Past Medical History  Diagnosis Date  . Bipolar 1 disorder   . HIV antibody positive   . COPD (chronic obstructive pulmonary disease)   . Asthma    Past Surgical History  Procedure Laterality Date  . Cesarean section     No family history on file. History  Substance Use Topics  . Smoking status: Current Some Day Smoker    Types: Cigarettes  . Smokeless tobacco: Never Used  . Alcohol Use: No   OB History    No data available     Review of Systems  Respiratory: Positive for cough.   Musculoskeletal: Positive for myalgias.      Allergies  Penicillins  Home Medications   Prior to Admission medications   Medication Sig Start Date End Date Taking? Authorizing Provider  Benzocaine-Pectin 15-5 MG LOZG Use as directed 1 lozenge in the mouth or throat every 2 (two) hours as  needed. Patient not taking: Reported on 07/12/2014 07/10/14   Ames DuraStephen Balleh, MD  benzonatate (TESSALON) 100 MG capsule Take 1 capsule (100 mg total) by mouth every 8 (eight) hours. 07/12/14   Vida RollerBrian D Miller, MD  dicyclomine (BENTYL) 20 MG tablet Take 1 tablet (20 mg total) by mouth 2 (two) times daily. Patient not taking: Reported on 07/12/2014 04/02/14   Gilda Creasehristopher J. Pollina, MD  elvitegravir-cobicistat-emtricitabine-tenofovir (STRIBILD) 150-150-200-300 MG TABS tablet Take 1 tablet by mouth daily with breakfast. 04/02/14   Rolland PorterMark James, MD  nitrofurantoin, macrocrystal-monohydrate, (MACROBID) 100 MG capsule Take 1 capsule (100 mg total) by mouth 2 (two) times daily with a meal. Patient not taking: Reported on 07/12/2014 03/26/14   Shuvon Rankin, NP  nitrofurantoin, macrocrystal-monohydrate, (MACROBID) 100 MG capsule Take 1 capsule (100 mg total) by mouth 2 (two) times daily. X 7 days Patient not taking: Reported on 07/12/2014 04/02/14   Gilda Creasehristopher J. Pollina, MD  OLANZapine (ZYPREXA) 5 MG tablet Take 1 tablet (5 mg total) by mouth at bedtime. Patient not taking: Reported on 07/12/2014 04/02/14   Rolland PorterMark James, MD  OLANZapine zydis (ZYPREXA) 5 MG disintegrating tablet Take 1 tablet (5 mg total) by mouth 2 (two) times daily. Patient not taking: Reported on 07/12/2014 03/26/14   Shuvon Rankin, NP  QUEtiapine (SEROQUEL) 25 MG tablet Take 1 tablet (25 mg  total) by mouth at bedtime. 04/02/14   Rolland Porter, MD   BP 120/74 mmHg  Pulse 97  Temp(Src) 98.1 F (36.7 C) (Oral)  Resp 18  SpO2 97%  LMP 06/09/2014 Physical Exam  Constitutional: She is oriented to person, place, and time. She appears well-developed and well-nourished. No distress.  HENT:  Head: Normocephalic and atraumatic.  Eyes: Conjunctivae are normal.  Neck: Normal range of motion.  Pulmonary/Chest: Effort normal.  Neurological: She is alert and oriented to person, place, and time. No sensory deficit.  Skin: Skin is dry.  Psychiatric: Her affect  is angry. She is agitated and aggressive.  Nursing note and vitals reviewed.   ED Course  Procedures (including critical care time)  DIAGNOSTIC STUDIES: Oxygen Saturation is 97% on room air, normal by my interpretation.    Imaging Review Dg Chest 2 View  07/18/2014   CLINICAL DATA:  Cough and body aches for 2 weeks  EXAM: CHEST  2 VIEW  COMPARISON:  07/10/2014  FINDINGS: There is moderate hyperinflation. This is unchanged. No superimposed acute infiltrate or congestive failure is evident. There are no pleural effusions. Hilar and mediastinal contours are unremarkable and unchanged.  IMPRESSION: Hyperinflation consistent with COPD. No acute cardiopulmonary findings.   Electronically Signed   By: Ellery Plunk M.D.   On: 07/18/2014 23:17    MDM   Final diagnoses:  Cough   NAD. Very aggressive, yelling throughout entire encounter. Discussed she had CXR 2 days ago, and symptoms ongoing for 6 months, ultimate care will be through PCP establishment. Pt requesting blood test for cancer, I advised her we do not do blood test for that in ED, and there were no nodules on her CXR from 2 days ago. Pt angry with this response, and states we don't do anything here in the ED, becomes louder, more agitated, and will be discharged with security nearby. Resources were given for PCP establishment.  I personally performed the services described in this documentation, which was scribed in my presence. The recorded information has been reviewed and is accurate.    Kathrynn Speed, PA-C 07/20/14 1646  Donnetta Hutching, MD 07/22/14 1051

## 2014-07-20 NOTE — ED Notes (Signed)
Bed: [id number] Expected date:  Expected time:  Means of arrival:  Comments: EMS- 53yo F, cough x 6 months

## 2014-07-20 NOTE — Discharge Instructions (Signed)
Cough, Adult ° A cough is a reflex that helps clear your throat and airways. It can help heal the body or may be a reaction to an irritated airway. A cough may only last 2 or 3 weeks (acute) or may last more than 8 weeks (chronic).  °CAUSES °Acute cough: °· Viral or bacterial infections. °Chronic cough: °· Infections. °· Allergies. °· Asthma. °· Post-nasal drip. °· Smoking. °· Heartburn or acid reflux. °· Some medicines. °· Chronic lung problems (COPD). °· Cancer. °SYMPTOMS  °· Cough. °· Fever. °· Chest pain. °· Increased breathing rate. °· High-pitched whistling sound when breathing (wheezing). °· Colored mucus that you cough up (sputum). °TREATMENT  °· A bacterial cough may be treated with antibiotic medicine. °· A viral cough must run its course and will not respond to antibiotics. °· Your caregiver may recommend other treatments if you have a chronic cough. °HOME CARE INSTRUCTIONS  °· Only take over-the-counter or prescription medicines for pain, discomfort, or fever as directed by your caregiver. Use cough suppressants only as directed by your caregiver. °· Use a cold steam vaporizer or humidifier in your bedroom or home to help loosen secretions. °· Sleep in a semi-upright position if your cough is worse at night. °· Rest as needed. °· Stop smoking if you smoke. °SEEK IMMEDIATE MEDICAL CARE IF:  °· You have pus in your sputum. °· Your cough starts to worsen. °· You cannot control your cough with suppressants and are losing sleep. °· You begin coughing up blood. °· You have difficulty breathing. °· You develop pain which is getting worse or is uncontrolled with medicine. °· You have a fever. °MAKE SURE YOU:  °· Understand these instructions. °· Will watch your condition. °· Will get help right away if you are not doing well or get worse. °Document Released: 11/15/2010 Document Revised: 08/11/2011 Document Reviewed: 11/15/2010 °ExitCare® Patient Information ©2015 [employer]. This information is not intended  to replace advice given to you by your health care provider. Make sure you discuss any questions you have with your health care provider. ° °Emergency Department Resource Guide °1) Find a Doctor and Pay Out of Pocket °Although you won't have to find out who is covered by your insurance plan, it is a good idea to ask around and get recommendations. You will then need to call the office and see if the doctor you have chosen will accept you as a new patient and what types of options they offer for patients who are self-pay. Some doctors offer discounts or will set up payment plans for their patients who do not have insurance, but you will need to ask so you aren't surprised when you get to your appointment. ° °2) Contact Your Local Health Department °Not all health departments have doctors that can see patients for sick visits, but many do, so it is worth a call to see if yours does. If you don't know where your local health department is, you can check in your phone book. The CDC also has a tool to help you locate your state's health department, and many state websites also have listings of all of their local health departments. ° °3) Find a Walk-in Clinic °If your illness is not likely to be very severe or complicated, you may want to try a walk in clinic. These are popping up all over the country in pharmacies, drugstores, and shopping centers. They're usually staffed by nurse practitioners or physician assistants that have been trained to treat common illnesses   and complaints. They're usually fairly quick and inexpensive. However, if you have serious medical issues or chronic medical problems, these are probably not your best option. ° °No Primary Care Doctor: °- Call [facility] at  832-8000 - they can help you locate a primary care doctor that  accepts your insurance, provides certain services, etc. °- Physician Referral Service- 1-800-533-3463 ° °Chronic Pain Problems: °Organization         Address  Phone    Notes  °[facility]  (336) 297-2271 Patients need to be referred by their primary care doctor.  ° °Medication Assistance: °Organization         Address  Phone   Notes  °[place] [facility] [address] °[place], [place] [address] (336) 641-8030 --Must be a resident of [place] °-- Must have NO insurance coverage whatsoever (no Medicaid/ Medicare, etc.) °-- The pt. MUST have a primary care doctor that directs their care regularly and follows them in the community °  °[facility]  (866) 331-1348   °[employer]  (888) 892-1162   ° °Agencies that provide inexpensive medical care: °Organization         Address  Phone   Notes  °[facility]  (336) 832-8035   °[facility]    (336) 832-7272   °[facility] [address] °[place], [place] [address] (336) 832-4777   °[facility] [address], °[place] (336) 271-4999   °[facility]    (336) 373-0678   °[facility]    (336) 272-1050   °[facility] ° [address], [place] Phone:  (336) 832-4444, Fax:  (336) 832-4440 Hours of Operation:  9 am - 6 pm, M-F.  Also accepts Medicaid/Medicare and self-pay.  °[facility] ° [address], [place] Phone: (336) 832-3150, Fax: (336) 832-3151. Hours of Operation:  8:30 am - 5:30 pm, M-F.  Also accepts Medicaid and self-pay.  °[facility] [place] [address], [place] Phone: (336) 878-6027   °[facility] [address], [place] (336)723-1848, Ext. 123 Mondays & Thursdays: 7-9 AM.  First 15 patients are seen on a first come, first serve basis. °  ° °Medicaid-accepting [place] Providers: ° °Organization         Address  Phone   Notes  °[facility] [address], [place] (336) 641-2100 Also accepts self-pay patients.  °[facility]  [address], [place] ° (336) 856-9996   °[facility] [address], [place] (336) 288-8857   °[facility] [address] [place] [address], [place] (336) 299-7000   °Veita Bland [address], [place]  ° (336) 373-1557 Only accepts [place] Access Medicaid patients after they have their name applied to their card.  ° °Self-Pay (no insurance) in [place]: ° °Organization         Address  Phone   Notes  °Sickle Cell Patients, [facility] [address], [place] (336) 832-1970   °[facility] [address], [place] (336) 832-4400   ° [facility] ° [address], [place] (336) 992-4800   °[facility]/Dr. Osei-Bonsu ° [address] [place] [address], [place] or [address], [place] (336) 841-8500 Phone number for both [place] and [place] locations is the same.  °[facility] [address], [place] (336) 299-0000   °[facility] [address] [place] [address], [place] or [address] (336) 852-7530 °(336) 878-2260   °[facility] [address], [place] (336) 350-1642, phone; (336) 294-5005, fax Sees patients 1st and 3rd Saturday of every month.  Must not qualify for public or private insurance (i.e. Medicaid, Medicare, [place] Health Choice, Veterans' Benefits) • Household income should be no more than 200% of the poverty level •The clinic cannot treat you if you are pregnant or think you are pregnant • Sexually transmitted diseases are not treated at the clinic.  ° ° °Dental Care: °Organization         Address  Phone  Notes  °[facility] [address], [place] (336) 641-6152 Accepts children up to age 21 who are enrolled in Medicaid or [place] Health Choice; pregnant women with a Medicaid card; and children who have  applied for Medicaid or [place] Health Choice, but were declined, whose parents can pay a reduced fee at time of service.  °[facility] [place]  [address], [place] (336) 641-7733 Accepts children up to age 21 who are enrolled in Medicaid or [place] Health Choice; pregnant women with a Medicaid card; and children who have applied for Medicaid or [place] Health Choice, but were declined, whose parents can pay a reduced fee at time of service.  °[facility] ° [address], [place] (336) 641-4533 Patients are seen by appointment only. Walk-ins are not accepted. [facility] will see patients 18 years of age and older. °Monday - Tuesday (8am-5pm) °Most Wednesdays (8:30-5pm) °$30 per visit, cash only  °[facility] ° [address], [place] (336) 641-4533 Patients are seen by appointment only. Walk-ins are not accepted. [facility] will see patients 18 years of age and older. °One Wednesday Evening (Monthly: Volunteer Based).  $30 per visit, cash only  °[facility]  (919) 537-3737 for adults; Children under age 4, call Graduate Pediatric Dentistry at ([phone]) [phone]. Children aged 4-14, please call (919) [phone] to request a pediatric application. ° Dental services are provided in all areas of dental care including fillings, crowns and bridges, complete and partial dentures, implants, gum treatment, root canals, and extractions. Preventive care is also provided. Treatment is provided to both adults and children. °Patients are selected via a lottery and there is often a waiting list. °  °[facility] [address], °[place] ° (336) 763-8833 [email or web address] °  °[facility] [address], [place], [place] (336)723-1848, Ext. 123 Second and Fourth Thursday of each month, opens at 6:30 AM; Clinic ends at 9 AM.  Patients are seen on a first-come first-served basis, and a  limited number are seen during each clinic.  ° °[facility] ° [address], [place],  (336) 723-7904   Eligibility Requirements °You must have lived in [place], [place], or [place] counties for at least the last three months. °  You cannot be eligible for state or federal sponsored healthcare insurance, including Veterans Administration, Medicaid, or Medicare. °  You generally cannot be eligible for healthcare insurance through your employer.  °  How to apply: °Eligibility screenings are held every Tuesday and Wednesday   afternoon from 1:00 pm until 4:00 pm. You do not need an appointment for the interview!  °[facility] [address], [place], [place] 336-631-2330   °[facility]  336-342-8273   °[facility]  336-703-3100   °[facility]  336-570-6415   ° °Behavioral Health Resources in the Community: °Intensive Outpatient Programs °Organization         Address  Phone  Notes  °[facility] [address], [place], [place] 336-878-6098   °[facility] [address], [place], [place] 336-832-9800   °[facility] [address], [place], [place] ° 336-882-2125   °[facility] [address],  °[place], [place] 1-800-853-5163 or 336-641-4981   °Substance Abuse Resources °Organization         Address  Phone  Notes  °[facility]  336-882-2125   °[facility]  336-784-9470   °[facility]  336-285-9073   °[facility]  336-845-3988   °Residential & Outpatient Substance Abuse Program  1-800-659-3381   °Psychological Services °Organization         Address  Phone  Notes  °[facility]  336- 832-9600   °[facility]  336- 378-7881   °[facility] [address], [place] 1-800-853-5163 or 336-641-4981   ° °Mobile Crisis Teams °Organization          Address  Phone  Notes  °[facility], Mobile Crisis Care Unit  1-877-626-1772   °Assertive °[facility] ° [address] [place], [place] 336-834-9664   °Sharon DeEsch [address] °[place] 336-554-5454   ° °Self-Help/Support Groups °Organization         Address  Phone             Notes  °[facility] - variety of support groups  336- 373-1402 Call for more information  °Narcotics Anonymous (NA), [facility] [address], °[place]  2 meetings at this location  ° °Residential Treatment Programs °Organization         Address  Phone  Notes  °[facility] [address],    °[place]  1-866-801-8205   °[facility] ° [address], [place], [place] 704-293-8524   °[facility] [address], [place] 336-845-3988 Admissions: 8am-3pm M-F  °[facility] [address]    °[place], [place] 336-841-1104   °[facility] [address], [place], [place] 336-379-7146   °[facility] [address]  °[place], [place] 336-285-9073   °[facility] - Intensive Outpatient [address], [place], [place] 336-852-3033   °[facility] [address]  °[place], [place] 1-877-615-2722 or 336-784-9470   °[facility] [address] [place], [place] 336-227-7417 Accepts Medicaid  °[facility] [address]  °[place] 1-800-659-3381 Substance Abuse/Addiction Treatment  ° °[place] Behavioral Health Resources °Organization         Address  Phone  Notes  °[facility]  (888) 581-9988   °Julie Brannon, PhD [address] [place], [place]   (336) 349-5553 or (336) 951-0000   °[facility]   [address] °[place], [place] (336) 349-4454   °[facility] [address], [place], [place] (336) 342-8316 Insurance/Medicaid/sponsorship  through [facility]  °[facility] [address]                                    [place], [place] (336) 342-8316 Therapy/tele-psych/case  °[facility] [address]  ° [place], [place] (336) 349-2233    °Dr. Arfeen  (336) 349-4544   °[facility] [place]  [employer] [place] [facility] 1) [address], [place] °2) [address], [place] °3)  [address], [place] (336) 349-3220 °(336) 342-7768 ° °(336) 342-8140   °[place] [facility] (336) 342-1394 or (336) 342-3537 (After Hours)    ° ° °

## 2014-07-20 NOTE — ED Notes (Signed)
Pt. reports persistent chronic dry cough for several months , respirations unlabored , denies fever or chills, seen at [facility] this afternoon for the same complaint .

## 2014-07-20 NOTE — Discharge Instructions (Signed)
Cough, Adult  A cough is a reflex that helps clear your throat and airways. It can help heal the body or may be a reaction to an irritated airway. A cough may only last 2 or 3 weeks (acute) or may last more than 8 weeks (chronic).  CAUSES Acute cough:  Viral or bacterial infections. Chronic cough:  Infections.  Allergies.  Asthma.  Post-nasal drip.  Smoking.  Heartburn or acid reflux.  Some medicines.  Chronic lung problems (COPD).  Cancer. SYMPTOMS   Cough.  Fever.  Chest pain.  Increased breathing rate.  High-pitched whistling sound when breathing (wheezing).  Colored mucus that you cough up (sputum). TREATMENT   A bacterial cough may be treated with antibiotic medicine.  A viral cough must run its course and will not respond to antibiotics.  Your caregiver may recommend other treatments if you have a chronic cough. HOME CARE INSTRUCTIONS   Only take over-the-counter or prescription medicines for pain, discomfort, or fever as directed by your caregiver. Use cough suppressants only as directed by your caregiver.  Use a cold steam vaporizer or humidifier in your bedroom or home to help loosen secretions.  Sleep in a semi-upright position if your cough is worse at night.  Rest as needed.  Stop smoking if you smoke. SEEK IMMEDIATE MEDICAL CARE IF:  1. You have pus in your sputum. 2. Your cough starts to worsen. 3. You cannot control your cough with suppressants and are losing sleep. 4. You begin coughing up blood. 5. You have difficulty breathing. 6. You develop pain which is getting worse or is uncontrolled with medicine. 7. You have a fever. MAKE SURE YOU:   Understand these instructions.  Will watch your condition.  Will get help right away if you are not doing well or get worse. Document Released: 11/15/2010 Document Revised: 08/11/2011 Document Reviewed: 11/15/2010 [facility] Patient Information 2015 [place], [place]. This information is not  intended to replace advice given to you by your health care provider. Make sure you discuss any questions you have with your health care provider. Chronic Obstructive Pulmonary Disease Chronic obstructive pulmonary disease (COPD) is a common lung condition in which airflow from the lungs is limited. COPD is a general term that can be used to describe many different lung problems that limit airflow, including both chronic bronchitis and emphysema. If you have COPD, your lung function will probably never return to normal, but there are measures you can take to improve lung function and make yourself feel better.  CAUSES   Smoking (common).   Exposure to secondhand smoke.   Genetic problems.  Chronic inflammatory lung diseases or recurrent infections. SYMPTOMS   Shortness of breath, especially with physical activity.   Deep, persistent (chronic) cough with a large amount of thick mucus.   Wheezing.   Rapid breaths (tachypnea).   Gray or bluish discoloration (cyanosis) of the skin, especially in fingers, toes, or lips.   Fatigue.   Weight loss.   Frequent infections or episodes when breathing symptoms become much worse (exacerbations).   Chest tightness. DIAGNOSIS  Your health care provider will take a medical history and perform a physical examination to make the initial diagnosis. Additional tests for COPD may include:   Lung (pulmonary) function tests.  Chest X-ray.  CT scan.  Blood tests. TREATMENT  Treatment available to help you feel better when you have COPD includes:   Inhaler and nebulizer medicines. These help manage the symptoms of COPD and make your breathing more comfortable.  Supplemental oxygen. Supplemental oxygen is only helpful if you have a low oxygen level in your blood.   Exercise and physical activity. These are beneficial for nearly all people with COPD. Some people may also benefit from a pulmonary rehabilitation program. HOME CARE  INSTRUCTIONS   Take all medicines (inhaled or pills) as directed by your health care provider.  Avoid over-the-counter medicines or cough syrups that dry up your airway (such as antihistamines) and slow down the elimination of secretions unless instructed otherwise by your health care provider.   If you are a smoker, the most important thing that you can do is stop smoking. Continuing to smoke will cause further lung damage and breathing trouble. Ask your health care provider for help with quitting smoking. He or she can direct you to community resources or hospitals that provide support.  Avoid exposure to irritants such as smoke, chemicals, and fumes that aggravate your breathing.  Use oxygen therapy and pulmonary rehabilitation if directed by your health care provider. If you require home oxygen therapy, ask your health care provider whether you should purchase a pulse oximeter to measure your oxygen level at home.   Avoid contact with individuals who have a contagious illness.  Avoid extreme temperature and humidity changes.  Eat healthy foods. Eating smaller, more frequent meals and resting before meals may help you maintain your strength.  Stay active, but balance activity with periods of rest. Exercise and physical activity will help you maintain your ability to do things you want to do.  Preventing infection and hospitalization is very important when you have COPD. Make sure to receive all the vaccines your health care provider recommends, especially the pneumococcal and influenza vaccines. Ask your health care provider whether you need a pneumonia vaccine.  Learn and use relaxation techniques to manage stress.  Learn and use controlled breathing techniques as directed by your health care provider. Controlled breathing techniques include:   Pursed lip breathing. Start by breathing in (inhaling) through your nose for 1 second. Then, purse your lips as if you were going to whistle  and breathe out (exhale) through the pursed lips for 2 seconds.   Diaphragmatic breathing. Start by putting one hand on your abdomen just above your waist. Inhale slowly through your nose. The hand on your abdomen should move out. Then purse your lips and exhale slowly. You should be able to feel the hand on your abdomen moving in as you exhale.   Learn and use controlled coughing to clear mucus from your lungs. Controlled coughing is a series of short, progressive coughs. The steps of controlled coughing are:  8. Lean your head slightly forward.  9. Breathe in deeply using diaphragmatic breathing.  10. Try to hold your breath for 3 seconds.  11. Keep your mouth slightly open while coughing twice.  12. Spit any mucus out into a tissue.  13. Rest and repeat the steps once or twice as needed. SEEK MEDICAL CARE IF:   You are coughing up more mucus than usual.   There is a change in the color or thickness of your mucus.   Your breathing is more labored than usual.   Your breathing is faster than usual.  SEEK IMMEDIATE MEDICAL CARE IF:   You have shortness of breath while you are resting.   You have shortness of breath that prevents you from:  Being able to talk.   Performing your usual physical activities.   You have chest pain lasting longer than 5  minutes.   Your skin color is more cyanotic than usual.  You measure low oxygen saturations for longer than 5 minutes with a pulse oximeter. MAKE SURE YOU:   Understand these instructions.  Will watch your condition.  Will get help right away if you are not doing well or get worse. Document Released: 02/26/2005 Document Revised: 10/03/2013 Document Reviewed: 01/13/2013 [facility] Patient Information 2015 [place]. This information is not intended to replace advice given to you by your health care provider. Make sure you discuss any questions you have with your health care provider. RESOURCE GUIDE  Chronic  Pain Problems: Contact [facility]  [phone] Patients need to be referred by their primary care doctor.  Insufficient Money for Medicine: Contact United Way:  call "211."   No Primary Care Doctor: - Call Health Connect  [phone] - can help you locate a primary care doctor that  accepts your insurance, provides certain services, etc. - Physician Referral Service- [phone]  Agencies that provide inexpensive medical care: - [facility]  [phone] - [facility]  [phone] - [facility]  [phone] - Women[facility] Clinic  [phone] - Planned Parenthood  [phone] - [facility]  [phone]  Medicaid-accepting [place] Providers: - [facility]- [address], [address]  [phone], Mon-Fri 9am-7pm, Sat 9am-1pm - [facility]- [address], [address] [place]  [phone] - [facility]- [address], [address] [place]  [phone] [facility]- [facility]- [address]  [phone] - Renaye RakersVeita Bland- [address], [address], [phone]  Only accepts [place] patients after they have their name  applied to their card  Self Pay (no insurance) in [place]: - Sickle Cell Patients: Dr Willey BladeEric Dean, [facility]  [address], [phone] - [facility]- [address]  [phone]       Redge Gainer-     [facility]- [address], [address]       -     [facility]- see information above (Speak to [employer]am H if you do not have insurance)       -  [facility] [place]- [address],  [phone]       -  [facility]- [address], [phone]       -  Dr Julio Sickssei-Bonsu-  [address], [address], [place], [phone]       -  [facility] - [address], [phone]       -  [facility]- [address], [phone], also [address], [phone]       -    [facility]- [address], [phone], 1st & 3rd Saturday        every month, 10am-1pm  1) Find a Doctor and Pay Out of Pocket Although you won't have to find out who is covered by your insurance plan, it is a good idea to ask around and get recommendations. You will then need to call the office and see if the doctor you have chosen will accept you as a new patient and what types of options they offer for patients who are self-pay. Some doctors offer discounts or will set up payment plans for their patients who do not have insurance, but you  will need to ask so you aren't surprised when you get to your appointment.  2) Contact Your Local Health Department Not all health departments have doctors that can see patients for sick visits, but many do, so it is worth a call to see if yours does. If you don't know where your local health department is, you can check in your phone book. The CDC also has a tool to help you locate your state's health department, and many state websites also have listings of all of their local health departments.  3) Find a Walk-in Clinic If your illness is not likely to be very severe or complicated, you may want to try a walk in clinic. These are popping up all over the country in pharmacies, drugstores, and shopping centers. They're usually staffed by nurse practitioners or physician assistants that have been trained to treat common illnesses and complaints. They're usually fairly quick and inexpensive. However, if you have serious medical issues or chronic medical problems, these are probably not your best option

## 2014-07-20 NOTE — ED Provider Notes (Signed)
CSN: [id number]     Arrival date & time 07/20/14  1915 History   First MD Initiated Contact with Patient 07/20/14 2021     Chief Complaint  Patient presents with  . Cough    (Consider location/radiation/quality/duration/timing/severity/associated sxs/prior Treatment) HPI Comments: 54 year old female with a history of bipolar 1 disorder, HIV (CD4 ~800 in 05/2014), COPD, and asthma presents to the emergency department for further evaluation of a cough. Patient states that she has had a dry cough intermittently over the past 6 months. Patient reports trying over-the-counter remedies as well as Tessalon for symptoms without relief. Patient also states that she has a pain in the right side of her chest with coughing and deep breathing. This is the patient's fifth visit to the emergency department in the last 10 days for evaluation of these complaints. Patient has had 2 negative chest x-rays. She was discharged from [facility] emergency department just prior to arrival in this ED. Patient states that she is concerned about cancer because her mother had COPD which "turned into cancer". She states that she recently relocated back to [place], from [place], and has no established care in the area.  Patient is a 54 y.o. female presenting with cough. The history is provided by the patient. No language interpreter was used.  Cough Associated symptoms: chest pain (R sided when coughing)     Past Medical History  Diagnosis Date  . Bipolar 1 disorder   . HIV antibody positive   . COPD (chronic obstructive pulmonary disease)   . Asthma    Past Surgical History  Procedure Laterality Date  . Cesarean section     No family history on file. History  Substance Use Topics  . Smoking status: Current Some Day Smoker    Types: Cigarettes  . Smokeless tobacco: Never Used  . Alcohol Use: No   OB History    No data available      Review of Systems  Constitutional: Positive for fatigue.  Respiratory:  Positive for cough.   Cardiovascular: Positive for chest pain (R sided when coughing).  All other systems reviewed and are negative.   Allergies  Penicillins  Home Medications   Prior to Admission medications   Medication Sig Start Date End Date Taking? Authorizing Provider  Benzocaine-Pectin 15-5 MG LOZG Use as directed 1 lozenge in the mouth or throat every 2 (two) hours as needed. Patient not taking: Reported on 07/12/2014 07/10/14   Ames Dura, MD  benzonatate (TESSALON) 100 MG capsule Take 1 capsule (100 mg total) by mouth every 8 (eight) hours. 07/12/14   Vida Roller, MD  dicyclomine (BENTYL) 20 MG tablet Take 1 tablet (20 mg total) by mouth 2 (two) times daily. Patient not taking: Reported on 07/12/2014 04/02/14   Gilda Crease, MD  elvitegravir-cobicistat-emtricitabine-tenofovir (STRIBILD) 150-150-200-300 MG TABS tablet Take 1 tablet by mouth daily with breakfast. 04/02/14   Rolland Porter, MD  guaiFENesin (ROBITUSSIN) 100 MG/5ML liquid Take 5-10 mLs (100-200 mg total) by mouth every 4 (four) hours as needed for cough. 07/20/14   Antony Madura, PA-C  nitrofurantoin, macrocrystal-monohydrate, (MACROBID) 100 MG capsule Take 1 capsule (100 mg total) by mouth 2 (two) times daily with a meal. Patient not taking: Reported on 07/12/2014 03/26/14   Shuvon Rankin, NP  nitrofurantoin, macrocrystal-monohydrate, (MACROBID) 100 MG capsule Take 1 capsule (100 mg total) by mouth 2 (two) times daily. X 7 days Patient not taking: Reported on 07/12/2014 04/02/14   Gilda Crease, MD  OLANZapine [facility])  5 MG tablet Take 1 tablet (5 mg total) by mouth at bedtime. Patient not taking: Reported on 07/12/2014 04/02/14   Rolland PorterMark James, MD  OLANZapine zydis (ZYPREXA) 5 MG disintegrating tablet Take 1 tablet (5 mg total) by mouth 2 (two) times daily. Patient not taking: Reported on 07/12/2014 03/26/14   Shuvon Rankin, NP  predniSONE (DELTASONE) 20 MG tablet Take 2 tablets (40 mg total) by mouth daily.  07/20/14   Antony MaduraKelly Josanne Boerema, PA-C  QUEtiapine (SEROQUEL) 25 MG tablet Take 1 tablet (25 mg total) by mouth at bedtime. 04/02/14   Rolland PorterMark James, MD   BP 125/73 mmHg  Pulse 103  Temp(Src) 98.3 F (36.8 C) (Oral)  Resp 18  SpO2 95%  LMP 06/09/2014   Physical Exam  Constitutional: She is oriented to person, place, and time. She appears well-developed and well-nourished. No distress.  Nontoxic/nonseptic appearing  HENT:  Head: Normocephalic and atraumatic.  Eyes: Conjunctivae and EOM are normal. No scleral icterus.  Neck: Normal range of motion.  Cardiovascular: Regular rhythm and intact distal pulses.  Tachycardia present.   Mild tachycardia  Pulmonary/Chest: Effort normal and breath sounds normal. No respiratory distress. She has no wheezes. She has no rales.  Respirations even and unlabored. Lungs clear. Sporadic dry, nonproductive cough appreciated at bedside.  Musculoskeletal: Normal range of motion.  Neurological: She is alert and oriented to person, place, and time. She exhibits normal muscle tone. Coordination normal.  GCS 15. Patient speaks in full goal oriented sentences. She moves extremities without ataxia.  Skin: Skin is warm and dry. No rash noted. She is not diaphoretic. No erythema. No pallor.  Psychiatric: She has a normal mood and affect. Her behavior is normal.  Nursing note and vitals reviewed.   ED Course  Procedures (including critical care time) Labs Review Labs Reviewed - No data to display  Imaging Review Dg Chest 2 View  07/18/2014   CLINICAL DATA:  Cough and body aches for 2 weeks  EXAM: CHEST  2 VIEW  COMPARISON:  07/10/2014  FINDINGS: There is moderate hyperinflation. This is unchanged. No superimposed acute infiltrate or congestive failure is evident. There are no pleural effusions. Hilar and mediastinal contours are unremarkable and unchanged.  IMPRESSION: Hyperinflation consistent with COPD. No acute cardiopulmonary findings.   Electronically Signed   By: Ellery Plunkaniel  R Mitchell M.D.   On: 07/18/2014 23:17     EKG Interpretation None      MDM   Final diagnoses:  Cough    54 year old female presents to the emergency department for further evaluation of cough. She has a history of asthma and COPD. Last CD4 count greater than 800 given her history of HIV. [facility] few hours [facility] to [facility]. Prior notes reference patient being very agitated and aggressive. She was previously escorted out of the emergency department by security.  Suspect that cough is secondary to prolonged COPD. No evidence of pneumonia on prior imaging. No fever. Vital signs stable compared with prior visits. No hypoxia, tachypnea, or dyspnea. Lungs clear. Doubt pulmonary embolism. Do not believe further emergent workup or imaging is indicated at this time. I agree with prior provider, that symptoms would best be further investigated and a primary care setting.  I approached the patient and a very calm manner. Patient, though appearing slightly agitated regarding the chronicity of her symptoms, is fairly reasonable when explained the reasons for her prior work up. I have discussed with the patient that  the emergency department is for the evaluation of emergent processes. I have stressed to her that the tests needed to help further investigate the underlying cause of her symptoms may not be available to her in this type of setting. I have also urged her to follow up with her primary care provider in the area. Patient has been provided a Facilities manager as well as referrals to the [facility], Dr. Ninetta Lights of infectious disease, and  pulmonary care. Patient appears comfortable with this plan. I have answered all of the questions that the patient has regarding her underlying symptoms; time spent in the room was >20 minutes. Return precautions discussed and provided. Patient agreeable to plan with no unaddressed concerns.  Patient discharged in good condition.   Filed Vitals:   07/20/14 1920 07/20/14 2144  BP: 127/81 125/73  Pulse: 104 103  Temp: 98.3 F (36.8 C) 98.3 F (36.8 C)  TempSrc: Oral Oral  Resp: 18 18  SpO2: 98% 95%     Antony Madura, PA-C 07/20/14 2225  Flint Melter, MD 07/20/14 2355

## 2014-07-20 NOTE — ED Notes (Signed)
Per EMS-cough for 6 months. Hx COPD, HIV, bronchitis. [facility]. VS: BP 144/90 HR 84 RR 18 SpO2 99%.

## 2014-07-21 ENCOUNTER — Emergency Department (HOSPITAL_COMMUNITY)
Admission: EM | Admit: 2014-07-21 | Discharge: 2014-07-21 | Disposition: A | Discharge status: Home / Self Care | Payer: Medicare Other | Attending: Emergency Medicine | Admitting: Emergency Medicine

## 2014-07-21 ENCOUNTER — Encounter (HOSPITAL_COMMUNITY): Payer: Self-pay | Admitting: *Deleted

## 2014-07-21 DIAGNOSIS — Z880 Allergy status to penicillin: Secondary | ICD-10-CM | POA: Insufficient documentation

## 2014-07-21 DIAGNOSIS — Z21 Asymptomatic human immunodeficiency virus [HIV] infection status: Secondary | ICD-10-CM | POA: Diagnosis not present

## 2014-07-21 DIAGNOSIS — F319 Bipolar disorder, unspecified: Secondary | ICD-10-CM | POA: Insufficient documentation

## 2014-07-21 DIAGNOSIS — J449 Chronic obstructive pulmonary disease, unspecified: Secondary | ICD-10-CM | POA: Diagnosis not present

## 2014-07-21 DIAGNOSIS — R059 Cough, unspecified: Secondary | ICD-10-CM

## 2014-07-21 DIAGNOSIS — Z79899 Other long term (current) drug therapy: Secondary | ICD-10-CM | POA: Insufficient documentation

## 2014-07-21 DIAGNOSIS — Z720 Tobacco use: Secondary | ICD-10-CM | POA: Diagnosis not present

## 2014-07-21 DIAGNOSIS — R1110 Vomiting, unspecified: Secondary | ICD-10-CM | POA: Diagnosis not present

## 2014-07-21 DIAGNOSIS — R05 Cough: Secondary | ICD-10-CM

## 2014-07-21 MED ORDER — ALBUTEROL SULFATE HFA 108 (90 BASE) MCG/ACT IN AERS
2.0000 | INHALATION_SPRAY | RESPIRATORY_TRACT | Status: DC | PRN
Start: 2014-07-21 — End: 2014-07-21
  Administered 2014-07-21: 2 via RESPIRATORY_TRACT
  Filled 2014-07-21: qty 6.7

## 2014-07-21 NOTE — Discharge Instructions (Signed)
Use inhaler as prescribed. Follow-up with your primary care physician. Return to the ED for new concerns.

## 2014-07-21 NOTE — ED Notes (Signed)
Cough for 6 months she was seen here last night and [facility] ed the day before

## 2014-07-21 NOTE — ED Provider Notes (Signed)
CSN: [id number]     Arrival date & time 07/21/14  1506 History  This chart was scribed for non-physician practitioner, Sharilyn Sites, PA-C, working with Mirian Mo, MD, by Bronson Curb, ED Scribe. This patient was seen in room TR11C/TR11C and the patient's care was started at 4:18 PM.    Chief Complaint  Patient presents with  . Cough    The history is provided by the patient. No language interpreter was used.     HPI Comments: Natalie Barrett is a 54 y.o. female, with history of HIV and COPD, who presents to the Emergency Department complaining of persistent, productive cough of the clear sputum for the past 6 months.  Patient has been evaluated twice this week for the same. She has been told that her prolonged cough is due to her COPD however she continues to smoke on a daily basis.  States today she had 1 episode of post-tussive emesis after eating a sandwich and drinking sweet tea.  No further episodes of vomiting.  No hematemesis.  No abdominal pain or nausea currently.  Patient is currently taking Tussin and prednisone without significant improvement. Patient states she no longer has her albuterol inhaler.  She does not have a home nebulizer machine.  She denies fever, chills, chest pain or SOB. Patient states her PCP is in [place], but has an appointment scheduled with a local PCP in approximately 1 month (April 2016).   Past Medical History  Diagnosis Date  . Bipolar 1 disorder   . HIV antibody positive   . COPD (chronic obstructive pulmonary disease)   . Asthma    Past Surgical History  Procedure Laterality Date  . Cesarean section     No family history on file. History  Substance Use Topics  . Smoking status: Current Some Day Smoker    Types: Cigarettes  . Smokeless tobacco: Never Used  . Alcohol Use: No   OB History    No data available     Review of Systems  Constitutional: Negative for fever and chills.  Respiratory: Positive for cough. Negative for shortness  of breath.   Gastrointestinal: Positive for vomiting (post-tussive).  All other systems reviewed and are negative.     Allergies  Penicillins  Home Medications   Prior to Admission medications   Medication Sig Start Date End Date Taking? Authorizing Provider  Benzocaine-Pectin 15-5 MG LOZG Use as directed 1 lozenge in the mouth or throat every 2 (two) hours as needed. Patient not taking: Reported on 07/12/2014 07/10/14   Ames Dura, MD  benzonatate (TESSALON) 100 MG capsule Take 1 capsule (100 mg total) by mouth every 8 (eight) hours. 07/12/14   Vida Roller, MD  dicyclomine (BENTYL) 20 MG tablet Take 1 tablet (20 mg total) by mouth 2 (two) times daily. Patient not taking: Reported on 07/12/2014 04/02/14   Gilda Crease, MD  elvitegravir-cobicistat-emtricitabine-tenofovir (STRIBILD) 150-150-200-300 MG TABS tablet Take 1 tablet by mouth daily with breakfast. 04/02/14   Rolland Porter, MD  guaiFENesin (ROBITUSSIN) 100 MG/5ML liquid Take 5-10 mLs (100-200 mg total) by mouth every 4 (four) hours as needed for cough. 07/20/14   Antony Madura, PA-C  nitrofurantoin, macrocrystal-monohydrate, (MACROBID) 100 MG capsule Take 1 capsule (100 mg total) by mouth 2 (two) times daily with a meal. Patient not taking: Reported on 07/12/2014 03/26/14   Shuvon Rankin, NP  nitrofurantoin, macrocrystal-monohydrate, (MACROBID) 100 MG capsule Take 1 capsule (100 mg total) by mouth 2 (two) times daily. X 7 days Patient not  taking: Reported on 07/12/2014 04/02/14   Gilda Creasehristopher J. Pollina, MD  OLANZapine (ZYPREXA) 5 MG tablet Take 1 tablet (5 mg total) by mouth at bedtime. Patient not taking: Reported on 07/12/2014 04/02/14   Rolland PorterMark James, MD  OLANZapine zydis (ZYPREXA) 5 MG disintegrating tablet Take 1 tablet (5 mg total) by mouth 2 (two) times daily. Patient not taking: Reported on 07/12/2014 03/26/14   Shuvon Rankin, NP  predniSONE (DELTASONE) 20 MG tablet Take 2 tablets (40 mg total) by mouth daily. 07/20/14   Antony MaduraKelly  Humes, PA-C  QUEtiapine (SEROQUEL) 25 MG tablet Take 1 tablet (25 mg total) by mouth at bedtime. 04/02/14   Rolland PorterMark James, MD   Triage Vitals: BP 127/71 mmHg  Pulse 113  Temp(Src) 99 F (37.2 C) (Oral)  Resp 20  SpO2 96%  LMP 06/09/2014  Physical Exam  Constitutional: She is oriented to person, place, and time. She appears well-developed and well-nourished. No distress.  NAD, answers phone during exam, speaking in full complete sentences without difficulty  HENT:  Head: Normocephalic and atraumatic.  Mouth/Throat: Oropharynx is clear and moist.  Eyes: Conjunctivae and EOM are normal. Pupils are equal, round, and reactive to light.  Neck: Normal range of motion. Neck supple.  Cardiovascular: Normal rate, regular rhythm and normal heart sounds.   Pulmonary/Chest: Effort normal and breath sounds normal. No respiratory distress. She has no wheezes. She has no rales.  Abdominal: Soft. Bowel sounds are normal. There is no tenderness. There is no guarding.  Musculoskeletal: Normal range of motion.  Neurological: She is alert and oriented to person, place, and time.  Skin: Skin is warm and dry. She is not diaphoretic.  Psychiatric: She has a normal mood and affect.  Nursing note and vitals reviewed.   ED Course  Procedures (including critical care time)  DIAGNOSTIC STUDIES: Oxygen Saturation is 96% on room air, normal by my interpretation.    COORDINATION OF CARE: At [id number] Discussed treatment plan with patient which includes inhaler. Patient agrees.   Labs Review Labs Reviewed - No data to display  Imaging Review No results found.   EKG Interpretation None      MDM   Final diagnoses:  Cough  Chronic obstructive pulmonary disease, unspecified COPD, unspecified chronic bronchitis type   54 y.o. F with chronic cough x 6 months.  She has known COPD and continues to smoke.  Patient was seen twice yesterday for the same, and again 3 days prior to that.  She had a CXR on 07/18/14  which was negative for acute findings, only chronic changes associated with her COPD.  On exam, patient afebrile and non-toxic in appearance.  She has no audible wheezes or rhonchi and speaks in full complete sentences as she answered cell phone during my exam.  I went over with patient that her exam and visits have normal recently and her VS are stable today as well.  Without CP or SOB, i highly doubt ACS, PE, dissection, or other acute cardiac event.  I do feel her symptoms are due to her COPD which will not be cured in the ED and will require long-tern management.  She was offered nebulizer treatment in ED and declined, however she did accept albuterol inhaler.  She has scheduled PCP FU in 1 month.  Encouraged smoking cessation, continue anti-tussives at home as well as prednisone.  Discussed plan with patient, he/she acknowledged understanding and agreed with plan of care.  Return precautions given for new or worsening symptoms.  I personally  performed the services described in this documentation, which was scribed in my presence. The recorded information has been reviewed and is accurate.  Garlon Hatchet, PA-C 07/21/14 1704  Mirian Mo, MD 07/21/14 2105

## 2014-07-23 ENCOUNTER — Encounter (HOSPITAL_COMMUNITY): Payer: Self-pay | Admitting: Emergency Medicine

## 2014-07-23 ENCOUNTER — Emergency Department (HOSPITAL_COMMUNITY)
Admission: EM | Admit: 2014-07-23 | Discharge: 2014-07-23 | Disposition: A | Discharge status: Home / Self Care | Payer: Medicare Other | Attending: Emergency Medicine | Admitting: Emergency Medicine

## 2014-07-23 DIAGNOSIS — Z720 Tobacco use: Secondary | ICD-10-CM | POA: Insufficient documentation

## 2014-07-23 DIAGNOSIS — Z880 Allergy status to penicillin: Secondary | ICD-10-CM | POA: Insufficient documentation

## 2014-07-23 DIAGNOSIS — Z760 Encounter for issue of repeat prescription: Secondary | ICD-10-CM | POA: Diagnosis present

## 2014-07-23 DIAGNOSIS — R053 Chronic cough: Secondary | ICD-10-CM

## 2014-07-23 DIAGNOSIS — F319 Bipolar disorder, unspecified: Secondary | ICD-10-CM | POA: Diagnosis not present

## 2014-07-23 DIAGNOSIS — R05 Cough: Secondary | ICD-10-CM

## 2014-07-23 DIAGNOSIS — Z79899 Other long term (current) drug therapy: Secondary | ICD-10-CM | POA: Insufficient documentation

## 2014-07-23 DIAGNOSIS — J441 Chronic obstructive pulmonary disease with (acute) exacerbation: Secondary | ICD-10-CM | POA: Insufficient documentation

## 2014-07-23 DIAGNOSIS — Z7952 Long term (current) use of systemic steroids: Secondary | ICD-10-CM | POA: Insufficient documentation

## 2014-07-23 MED ORDER — QUETIAPINE FUMARATE 50 MG PO TABS: 50.0000 mg | ORAL_TABLET | Freq: Two times a day (BID) | ORAL | Status: AC

## 2014-07-23 MED ORDER — GUAIFENESIN-CODEINE 100-10 MG/5ML PO SOLN: 5.0000 mL | Freq: Four times a day (QID) | ORAL | Status: AC | PRN

## 2014-07-23 MED ORDER — ELVITEG-COBIC-EMTRICIT-TENOFDF 150-150-200-300 MG PO TABS: 1.0000 | ORAL_TABLET | Freq: Every day | ORAL | Status: AC

## 2014-07-23 NOTE — Discharge Instructions (Signed)
Cough, Adult  A cough is a reflex that helps clear your throat and airways. It can help heal the body or may be a reaction to an irritated airway. A cough may only last 2 or 3 weeks (acute) or may last more than 8 weeks (chronic).  CAUSES Acute cough:  Viral or bacterial infections. Chronic cough:  Infections.  Allergies.  Asthma.  Post-nasal drip.  Smoking.  Heartburn or acid reflux.  Some medicines.  Chronic lung problems (COPD).  Cancer. SYMPTOMS   Cough.  Fever.  Chest pain.  Increased breathing rate.  High-pitched whistling sound when breathing (wheezing).  Colored mucus that you cough up (sputum). TREATMENT   A bacterial cough may be treated with antibiotic medicine.  A viral cough must run its course and will not respond to antibiotics.  Your caregiver may recommend other treatments if you have a chronic cough. HOME CARE INSTRUCTIONS   Only take over-the-counter or prescription medicines for pain, discomfort, or fever as directed by your caregiver. Use cough suppressants only as directed by your caregiver.  Use a cold steam vaporizer or humidifier in your bedroom or home to help loosen secretions.  Sleep in a semi-upright position if your cough is worse at night.  Rest as needed.  Stop smoking if you smoke. SEEK IMMEDIATE MEDICAL CARE IF:   You have pus in your sputum.  Your cough starts to worsen.  You cannot control your cough with suppressants and are losing sleep.  You begin coughing up blood.  You have difficulty breathing.  You develop pain which is getting worse or is uncontrolled with medicine.  You have a fever. MAKE SURE YOU:   Understand these instructions.  Will watch your condition.  Will get help right away if you are not doing well or get worse. Document Released: 11/15/2010 Document Revised: 08/11/2011 Document Reviewed: 11/15/2010 ExitCare Patient Information 2015 [employer]. This information is not intended  to replace advice given to you by your health care provider. Make sure you discuss any questions you have with your health care provider.  

## 2014-07-23 NOTE — ED Notes (Signed)
While being triaged pt sts that she has had a cough x 6 months and has been out of her medications x 2 weeks and needs those refilled. Pt denies pain and SOB, Denies chest pain. A&Ox4. Ambulatory.

## 2014-07-23 NOTE — ED Notes (Signed)
Per EMS[facility]. Pt clear in all lung fields. Pt has also been out of her meds x 2 weeks. Pt sts she is taking robitussin for the wheezing and it's not working. Pt sts she doesn't want her Seroquel refilled because she had a friend that didn't wake up after taking it. Pt ambulatory and A&Ox4.

## 2014-07-23 NOTE — ED Provider Notes (Signed)
CSN: [id number]     Arrival date & time 07/23/14  1857 History  This chart was scribed for Arthor Captain, PA-C, working with Purvis Sheffield, MD by Chestine Spore, ED Scribe. The patient was seen in room WTR7/WTR7 at 7:42 PM.     Chief Complaint  Patient presents with  . Medication Refill  . Cough     The history is provided by the patient. No language interpreter was used.    HPI Comments: Natalie Barrett is a 54 y.o. female with a medical hx of COPD, Asthma, and Bipolar 1 disorder who presents to the Emergency Department complaining of medication refill. Pt has been out of her seroquel for the past two weeks, and out of her antivirals for a few months. Pt was given Robitussin, Prednisone, and an Inhaler. Pt notes that the inhaler is not doing anything for her symptoms and she is using it properly. Pt denies smoking anymore and she stopped 3 weeks ago. She states that she is having associated symptoms of dry non-productive cough, dizziness, wheezing, night sweats. Pt cough has been going on for 6 months. Pt mother had bronchitis for a long time that led to her dying from cancer and the pt is concerned.  She denies fever and any other symptoms. The pt does not have a PCP or Infectious Disease doctor because she recently moved to [place] 3 weeks ago. Pt is Rx [facility]. Pt last CD4 count 669 and the viral load was 110 1.5 months ago. 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 Pt is currently homeless and she stayed [facility] but they do not currently have beds. Pt is on disability and she has been on it since 1998.    Past Medical History  Diagnosis Date  . Bipolar 1 disorder   . HIV antibody positive   . COPD (chronic obstructive pulmonary disease)   . Asthma    Past Surgical History  Procedure Laterality Date  . Cesarean section     No family history on file. History  Substance Use Topics  . Smoking status:  Current Some Day Smoker    Types: Cigarettes  . Smokeless tobacco: Never Used  . Alcohol Use: No   OB History    No data available     Review of Systems  Constitutional: Negative for fever and chills.  HENT: Negative for trouble swallowing.   Respiratory: Positive for cough and wheezing. Negative for shortness of breath.   Cardiovascular: Negative for chest pain.  Gastrointestinal: Negative for nausea, vomiting, abdominal pain, diarrhea and constipation.  Genitourinary: Negative for dysuria and hematuria.  Musculoskeletal: Negative for myalgias and arthralgias.  Skin: Negative for rash.  Neurological: Positive for dizziness. Negative for numbness.  All other systems reviewed and are negative.    A complete 10 system review of systems was obtained and all systems are negative except as noted in the HPI and PMH.   Allergies  Penicillins  Home Medications   Prior to Admission medications   Medication Sig Start Date End Date Taking? Authorizing Provider  Benzocaine-Pectin 15-5 MG LOZG Use as directed 1 lozenge in the mouth or throat every 2 (two) hours as needed. Patient not taking: Reported on 07/12/2014 07/10/14   Ames Dura, MD  benzonatate (TESSALON) 100 MG capsule Take 1 capsule (100 mg total) by mouth every 8 (eight) hours. 07/12/14   Vida Roller, MD  dicyclomine (BENTYL)  20 MG tablet Take 1 tablet (20 mg total) by mouth 2 (two) times daily. Patient not taking: Reported on 07/12/2014 04/02/14   Gilda Creasehristopher J. Pollina, MD  elvitegravir-cobicistat-emtricitabine-tenofovir (STRIBILD) 150-150-200-300 MG TABS tablet Take 1 tablet by mouth daily with breakfast. 04/02/14   Rolland PorterMark James, MD  guaiFENesin (ROBITUSSIN) 100 MG/5ML liquid Take 5-10 mLs (100-200 mg total) by mouth every 4 (four) hours as needed for cough. 07/20/14   Antony MaduraKelly Humes, PA-C  nitrofurantoin, macrocrystal-monohydrate, (MACROBID) 100 MG capsule Take 1 capsule (100 mg total) by mouth 2 (two) times daily with a  meal. Patient not taking: Reported on 07/12/2014 03/26/14   Shuvon Rankin, NP  nitrofurantoin, macrocrystal-monohydrate, (MACROBID) 100 MG capsule Take 1 capsule (100 mg total) by mouth 2 (two) times daily. X 7 days Patient not taking: Reported on 07/12/2014 04/02/14   Gilda Creasehristopher J. Pollina, MD  OLANZapine (ZYPREXA) 5 MG tablet Take 1 tablet (5 mg total) by mouth at bedtime. Patient not taking: Reported on 07/12/2014 04/02/14   Rolland PorterMark James, MD  OLANZapine zydis (ZYPREXA) 5 MG disintegrating tablet Take 1 tablet (5 mg total) by mouth 2 (two) times daily. Patient not taking: Reported on 07/12/2014 03/26/14   Shuvon Rankin, NP  predniSONE (DELTASONE) 20 MG tablet Take 2 tablets (40 mg total) by mouth daily. 07/20/14   Antony MaduraKelly Humes, PA-C  QUEtiapine (SEROQUEL) 25 MG tablet Take 1 tablet (25 mg total) by mouth at bedtime. 04/02/14   Rolland PorterMark James, MD   BP 130/81 mmHg  Pulse 87  Temp(Src) 98.1 F (36.7 C) (Oral)  Resp 18  SpO2 97%  LMP 06/09/2014  Physical Exam  Constitutional: She is oriented to person, place, and time. She appears well-developed and well-nourished. No distress.  HENT:  Head: Normocephalic and atraumatic.  Eyes: Conjunctivae and EOM are normal. No scleral icterus.  Neck: Normal range of motion. Neck supple. No tracheal deviation present.  Cardiovascular: Normal rate, regular rhythm and normal heart sounds.  Exam reveals no gallop and no friction rub.   No murmur heard. Pulmonary/Chest: Effort normal and breath sounds normal. No respiratory distress. She has no wheezes. She has no rales.  Barking cough  Abdominal: Soft. Bowel sounds are normal. She exhibits no distension and no mass. There is no tenderness. There is no guarding.  Musculoskeletal: Normal range of motion.  Neurological: She is alert and oriented to person, place, and time.  Skin: Skin is warm and dry. She is not diaphoretic.  Psychiatric: She has a normal mood and affect. Her behavior is normal.  Nursing note and  vitals reviewed.   ED Course  Procedures (including critical care time) DIAGNOSTIC STUDIES: Oxygen Saturation is 97% on room air, normal by my interpretation.    COORDINATION OF CARE: 8:071 PM-Discussed treatment plan which includes consult to Case management and Z-pack with pt at bedside and pt agreed to plan.   Labs Review Labs Reviewed - No data to display  Imaging Review No results found.   EKG Interpretation None      MDM   Final diagnoses:  Chronic cough  Medication refill    Patient given refill on her seroquel. Her cxr from 2/16 was negative for any acute abnormalities. I have contacted Case management about getting the patient into [facility] for further management of her chronic medical conditions.  Security allowed patient to stay in waiting room overnight. [facility]  GIven the high CD4 count she had just 1.5 months ago I  doubt any severe respiratory problem or AIDS defining illness as the source of her cough. Previous cxr shows changes of COPD. Patient will follow up per Anola Gurney who I have asked to set the patient up with care. parient expresses gratitude and agrees with POC. I have discussed reasons to seek immediate care at the ED.  The patient appears reasonably screened and/or stabilized for discharge and I doubt any other medical condition or other [facility] requiring further screening, evaluation, or treatment in the ED at this time prior to discharge.  I personally performed the services described in this documentation, which was scribed in my presence. The recorded information has been reviewed and is accurate.         Arthor Captain, PA-C 07/24/14 1037  Purvis Sheffield, MD 08/01/14 1049

## 2014-07-24 NOTE — Progress Notes (Signed)
ED CM received a message from A. Harris PA-C in the ED concerning establishing f/u care. Spoke with patient, she states she is HIV + and have been off meds since [place] back [place]. [facility]. CM sent  referral to scheduler. Paient agreeable with plan. Explained that the scheduler at the clinic will contact patient at the number verified in the record to schedule an appointment.. No further ED CM needs identified.

## 2014-07-25 ENCOUNTER — Encounter (HOSPITAL_COMMUNITY): Payer: Self-pay | Admitting: Emergency Medicine

## 2014-07-25 ENCOUNTER — Emergency Department (HOSPITAL_COMMUNITY)
Admission: EM | Admit: 2014-07-25 | Discharge: 2014-07-25 | Disposition: A | Discharge status: Home / Self Care | Payer: Medicare Other | Attending: Emergency Medicine | Admitting: Emergency Medicine

## 2014-07-25 DIAGNOSIS — Z21 Asymptomatic human immunodeficiency virus [HIV] infection status: Secondary | ICD-10-CM | POA: Diagnosis not present

## 2014-07-25 DIAGNOSIS — Z880 Allergy status to penicillin: Secondary | ICD-10-CM | POA: Insufficient documentation

## 2014-07-25 DIAGNOSIS — R05 Cough: Secondary | ICD-10-CM | POA: Diagnosis present

## 2014-07-25 DIAGNOSIS — Z7952 Long term (current) use of systemic steroids: Secondary | ICD-10-CM | POA: Diagnosis not present

## 2014-07-25 DIAGNOSIS — Z79899 Other long term (current) drug therapy: Secondary | ICD-10-CM | POA: Diagnosis not present

## 2014-07-25 DIAGNOSIS — F319 Bipolar disorder, unspecified: Secondary | ICD-10-CM | POA: Diagnosis not present

## 2014-07-25 DIAGNOSIS — Z720 Tobacco use: Secondary | ICD-10-CM | POA: Diagnosis not present

## 2014-07-25 DIAGNOSIS — J449 Chronic obstructive pulmonary disease, unspecified: Secondary | ICD-10-CM | POA: Insufficient documentation

## 2014-07-25 DIAGNOSIS — R059 Cough, unspecified: Secondary | ICD-10-CM

## 2014-07-25 NOTE — ED Notes (Signed)
Pt transported from a bystanders apartment c/o cough x 6 months, [facility]. Pt also would like BH assessment

## 2014-07-25 NOTE — Discharge Instructions (Signed)
°Emergency Department Resource Guide °1) Find a Doctor and Pay Out of Pocket °Although you won't have to find out who is covered by your insurance plan, it is a good idea to ask around and get recommendations. You will then need to call the office and see if the doctor you have chosen will accept you as a new patient and what types of options they offer for patients who are self-pay. Some doctors offer discounts or will set up payment plans for their patients who do not have insurance, but you will need to ask so you aren't surprised when you get to your appointment. ° °2) Contact Your Local Health Department °Not all health departments have doctors that can see patients for sick visits, but many do, so it is worth a call to see if yours does. If you don't know where your local health department is, you can check in your phone book. The CDC also has a tool to help you locate your state's health department, and many state websites also have listings of all of their local health departments. ° °3) Find a Walk-in Clinic °If your illness is not likely to be very severe or complicated, you may want to try a walk in clinic. These are popping up all over the country in pharmacies, drugstores, and shopping centers. They're usually staffed by nurse practitioners or physician assistants that have been trained to treat common illnesses and complaints. They're usually fairly quick and inexpensive. However, if you have serious medical issues or chronic medical problems, these are probably not your best option. ° °No Primary Care Doctor: °- Call Health Connect at  832-8000 - they can help you locate a primary care doctor that  accepts your insurance, provides certain services, etc. °- Physician Referral Service- 1-800-533-3463 ° °Chronic Pain Problems: °Organization         Address  Phone   Notes  °[facility]  (336) 297-2271 Patients need to be referred by their primary care doctor.  ° °Medication  Assistance: °Organization         Address  Phone   Notes  °[place] [facility] [address] °[place], [place] [address] (336) 641-8030 --Must be a resident of [place] °-- Must have NO insurance coverage whatsoever (no Medicaid/ Medicare, etc.) °-- The pt. MUST have a primary care doctor that directs their care regularly and follows them in the community °  °[facility]  (866) 331-1348   °[employer] Way  (888) 892-1162   ° °Agencies that provide inexpensive medical care: °Organization         Address  Phone   Notes  °[facility]  (336) 832-8035   °[facility]    (336) 832-7272   °[facility] [address] °[place], [place] [address] (336) 832-4777   °[facility] [address], °[place] (336) 271-4999   °[facility]    (336) 373-0678   °[facility]    (336) 272-1050   °[facility] ° [address], [place] Phone:  (336) 832-4444, Fax:  (336) 832-4440 Hours of Operation:  9 am - 6 pm, M-F.  Also accepts Medicaid/Medicare and self-pay.  °[facility] ° [address], [place] Phone: (336) 832-3150, Fax: (336) [phone]. Hours of Operation:  8:30 am - 5:30 pm, M-F.  Also accepts Medicaid and self-pay.  °[facility] [place] [address], [place] Phone: (336) 878-6027   °[facility] [address], [place], [place] (336)723-1848, Ext. 123 Mondays & Thursdays: 7-9 AM.  First 15 patients are seen on a first come, first serve basis. °  ° °Medicaid-accepting [place] Providers: ° °Organization         Address  Phone   Notes  °[facility] [address], [place] (336) 641-2100 Also accepts self-pay patients.  °[facility] [address], [place] ° (336) 856-9996   °[facility] [address], [place]  (336) 288-8857   °[facility] [address] [place] [address], [place] (336) 299-7000   °Veita Bland [address], [place]  ° (336) 373-1557 Only accepts [place] Access Medicaid patients after they have their name applied to their card.  ° °Self-Pay (no insurance) in [place]: ° °Organization         Address  Phone   Notes  °Sickle Cell Patients, [facility] [address], [place] (336) 832-1970   °[facility] [address], [place] (336) 832-4400   °[facility] ° [address], [place] (336) 992-4800   °[facility]/Dr. Osei-Bonsu ° [address] [place] [address], [place] or [address], [place] (336) 841-8500 Phone number for both [place] and [place] locations is the same.  °[facility] [address], [place] (336) 299-0000   °[facility] [address] [place] [address], [place] or [address] (336) 852-7530 °(336) 878-2260   °[facility] [address], [place] (336) 350-1642, phone; (336) 294-5005, fax Sees patients 1st and 3rd Saturday of every month.  Must not qualify for public or private insurance (i.e. Medicaid, Medicare, [place] Health Choice, Veterans' Benefits) • Household income should be no more than 200% of the poverty level •The clinic cannot treat you if you are pregnant or think you are pregnant • Sexually transmitted diseases are not treated at the clinic.  ° ° °Dental Care: °Organization         Address  Phone  Notes  °[place] [facility] [address], [place] (336) 641-6152 Accepts children up to age 21 who are enrolled in Medicaid or [place] Health Choice; pregnant women with a Medicaid card; and children who have applied for Medicaid or [place] Health Choice, but were declined, whose parents can pay a reduced fee at time of service.  °[place]  [facility] [place]  [address], [place] (336) 641-7733 Accepts children up to age 21 who are enrolled in Medicaid or [place] Health Choice; pregnant women with a Medicaid card; and children who have applied for Medicaid or [place] Health Choice, but were declined, whose parents can pay a reduced fee at time of service.  °[facility] ° [address], [place] (336) 641-4533 Patients are seen by appointment only. Walk-ins are not accepted. [facility] will see patients 18 years of age and older. °Monday - Tuesday (8am-5pm) °Most Wednesdays (8:30-5pm) °$30 per visit, cash only  °[facility] ° [address], [place] (336) 641-4533 Patients are seen by appointment only. Walk-ins are not accepted. [facility] will see patients 18 years of age and older. °One   Wednesday Evening (Monthly: Volunteer Based).  $30 per visit, cash only  °[facility]  (919) 537-3737 for adults; Children under age 4, call Graduate Pediatric Dentistry at ([phone]) [phone]. Children aged 4-14, please call (919) [phone] to request a pediatric application. ° Dental services are provided in all areas of dental care including fillings, crowns and bridges, complete and partial dentures, implants, gum treatment, root canals, and extractions. Preventive care is also provided. Treatment is provided to both adults and children. °Patients are selected via a lottery and there is often a waiting list. °  °[facility] [address], °[place] ° (336) 763-8833 [email or web address] °  °[facility] [address], [place], [place] (336)723-1848, Ext. 123 Second and Fourth Thursday of each month, opens at 6:30 AM; Clinic ends at 9 AM.  Patients are seen on a first-come first-served basis, and a limited number are seen during each clinic.  ° °[facility] ° [address], [place], [place] (336) 723-7904    Eligibility Requirements °You must have lived in [place], [place], or [place] counties for at least the last three months. °  You cannot be eligible for state or federal sponsored healthcare insurance, including Veterans Administration, Medicaid, or Medicare. °  You generally cannot be eligible for healthcare insurance through your employer.  °  How to apply: °Eligibility screenings are held every Tuesday and Wednesday afternoon from 1:00 pm until 4:00 pm. You do not need an appointment for the interview!  °[facility] [address], [place], [place] 336-631-2330   °[facility]  336-342-8273   °[place]  336-703-3100   °[facility]  336-570-6415   ° °Behavioral Health Resources in the Community: °Intensive Outpatient Programs °Organization         Address  Phone  Notes  °[facility] [address], [place], [place] 336-878-6098   °[facility] [address], [place], [place] 336-832-9800   °[facility] [address], [place], [place] ° 336-882-2125   °[facility] [address],  °[place], [place] 1-800-853-5163 or 336-641-4981   °Substance Abuse Resources °Organization         Address  Phone  Notes  °[facility]  336-882-2125   °[facility]  336-784-9470   °[facility]  336-285-9073   °[facility]  336-845-3988   °Residential & Outpatient Substance Abuse Program  1-800-659-3381   °Psychological Services °Organization         Address  Phone  Notes  °[facility]  336- 832-9600   °[facility]  336- 378-7881   °[facility] [address], [place] 1-800-853-5163 or 336-641-4981   ° °Mobile Crisis Teams °Organization         Address  Phone  Notes  °[facility], Mobile Crisis Care Unit  1-877-626-1772   °Assertive °[facility] ° [address]  [place], [place] 336-834-9664   °Sharon DeEsch [address] °[place] 336-554-5454   ° °Self-Help/Support Groups °Organization         Address  Phone             Notes  °[facility] of  - variety of support groups  336- 373-1402 Call for more information  °Narcotics Anonymous (NA), [facility] [address], °[place]  2 meetings at this location  ° °  Residential Treatment Programs Organization         Address  Phone  Notes  [facility] [address],    [place]  [phone]   [facility]  [address], [place] [phone], [place], [place] [phone]   [facility] [address], [place] [phone]) [phone] Admissions: 8am-3pm M-F  [facility] [address]    [place], [place] [phone]   [facility] [address], [place], [place] [phone]   [facility] [address]  [place], [place] [phone]   [facility] Intensive Outpatient [address], [place], [place] [phone]   [facility] ([facility] [address]  [place], [place] [phone] or [phone](337) 396-6411   [facility] ([facility] [address] [place], [place] [phone] Accepts Medicaid  [facility] [address]  [place] [phone] Substance Abuse/Addiction Treatment   [facility] Organization         Address  Phone  Notes  [facility]  413-585-2715(888) (204)[phone]   Angie FavaJulie Brannon, PhD [address], [address] [place], [place]   (240) [phone](336) (218)[phone] or [phone](336) [phone]   [facility]   [address] [place], [place] (928) [phone](336) ([phone]   [facility] [address], [place], [place] (340) [phone](336) [phone] Insurance/Medicaid/sponsorship through [facility] [address]                                    [place], [place] ([phone](336) [phone] Therapy/tele-psych/case    [facility] [address]   [place], [place] [phone](336) [phone]    Dr. Lolly MustacheArfeen  [phone](336) [phone]   [facility]  [employer] [facility] 1) [address], [place] 2) [address], [place] 3)  [address], [place] [phone](336) [phone](336) [phone]  [phone](336) [phone]   [facility] Child Abuse Hotline 807-015-8706(336) 574-671-3630 or 920-837-1588(336) [phone] (After Hours)      Cough, Adult  A cough is a reflex that helps clear your throat and airways. It can help heal the body or may be a reaction to an irritated airway. A cough may only last 2 or 3 weeks (acute) or may last more than 8 weeks (chronic).  CAUSES Acute cough:  Viral or bacterial infections. Chronic cough:  Infections.  Allergies.  Asthma.  Post-nasal drip.  Smoking.  Heartburn or acid reflux.  Some medicines.  Chronic lung problems (COPD).  Cancer. SYMPTOMS   Cough.  Fever.  Chest pain.  Increased breathing rate.  High-pitched whistling sound when breathing (wheezing).  Colored mucus that you cough up (sputum). TREATMENT   A bacterial cough may be treated with antibiotic medicine.  A viral cough must run its course and will not respond to antibiotics.  Your caregiver may recommend other treatments if you have a chronic cough. HOME CARE INSTRUCTIONS   Only take over-the-counter or prescription medicines for pain, discomfort, or fever as directed by your caregiver. Use cough suppressants only as directed by your caregiver.  Use a cold steam vaporizer or humidifier in your bedroom or home to help loosen secretions.  Sleep in a semi-upright position if your cough is worse at night.  Rest as needed.  Stop smoking if you smoke. SEEK IMMEDIATE MEDICAL CARE IF:   You have pus  in your sputum.  Your cough starts to worsen.  You cannot control your cough with suppressants and are losing sleep.  You begin coughing up blood.  You have difficulty breathing.  You  develop pain which is getting worse or is uncontrolled with medicine.  You have a fever. MAKE SURE YOU:   Understand these instructions.  Will watch your condition.  Will get help right away if you are not doing well or get worse. Document Released: 11/15/2010 Document Revised: 08/11/2011 Document Reviewed: 11/15/2010 [facility] Patient Information 2015 [place], [place]. This information is not intended to replace advice given to you by your health care provider. Make sure you discuss any questions you have with your health care provider.

## 2014-07-25 NOTE — ED Notes (Addendum)
Patient reports cough for 6 months.  Reports [facility] she has [facility] for guaifenesin-codeine.  Reports she does not have the money to have this filled and that they said they would fill it but that they did not have the money to have this filled. Reports her OTC medicines are not helping.  Reports that she has run out of seroquel.  Reports without this she gets "out of control".  Reports that [facility] because her "anger is out of control" because she is out of her medication and she is bipolar. Denies at present wanting to hurt herself or others but states "I just loose control and there is no telling what I will do".

## 2014-07-25 NOTE — ED Provider Notes (Signed)
CSN: [id number]     Arrival date & time 07/25/14  1949 History  This chart was scribed for Elpidio Anis, PA-C working with Enid Skeens, MD by Evon Slack, ED Scribe. This patient was seen in room WTR6/WTR6 and the patient's care was started at 9:12 PM.     Chief Complaint  Patient presents with  . Cough   The history is provided by the patient. No language interpreter was used.   HPI Comments: Natalie Barrett is a 54 y.o. female with PMHx of COPD asthma, bipolar disorder, and HIV who presents to the Emergency Department complaining of worsening cough onset 6 months prior. Pt states that she has been prescribed several medications and inhaler that have not provided any relief. Pt doesn't report any alleviating factors. Pt states that her symptoms are worse at night. Pt states she was not able to get a recent prescription filled due to not having the finances. Pt states that she is out of medications and her anger is worsening and is requesting to contact behavioral health. Pt denies abdominal pain. Pt denies HI or SI.   Past Medical History  Diagnosis Date  . Bipolar 1 disorder   . HIV antibody positive   . COPD (chronic obstructive pulmonary disease)   . Asthma    Past Surgical History  Procedure Laterality Date  . Cesarean section     History reviewed. No pertinent family history. History  Substance Use Topics  . Smoking status: Current Some Day Smoker    Types: Cigarettes  . Smokeless tobacco: Never Used  . Alcohol Use: No   OB History    No data available     Review of Systems  Respiratory: Positive for cough.   Gastrointestinal: Negative for abdominal pain.  All other systems reviewed and are negative.    Allergies  Penicillins  Home Medications   Prior to Admission medications   Medication Sig Start Date End Date Taking? Authorizing Provider  Benzocaine-Pectin 15-5 MG LOZG Use as directed 1 lozenge in the mouth or throat every 2 (two) hours as  needed. Patient not taking: Reported on 07/12/2014 07/10/14   Ames Dura, MD  dicyclomine (BENTYL) 20 MG tablet Take 1 tablet (20 mg total) by mouth 2 (two) times daily. Patient not taking: Reported on 07/12/2014 04/02/14   Gilda Crease, MD  elvitegravir-cobicistat-emtricitabine-tenofovir (STRIBILD) 150-150-200-300 MG TABS tablet Take 1 tablet by mouth daily with breakfast. 07/23/14   Arthor Captain, PA-C  guaiFENesin-codeine 100-10 MG/5ML syrup Take 5-10 mLs by mouth every 6 (six) hours as needed for cough. 07/23/14   Arthor Captain, PA-C  nitrofurantoin, macrocrystal-monohydrate, (MACROBID) 100 MG capsule Take 1 capsule (100 mg total) by mouth 2 (two) times daily with a meal. Patient not taking: Reported on 07/12/2014 03/26/14   Shuvon Rankin, NP  nitrofurantoin, macrocrystal-monohydrate, (MACROBID) 100 MG capsule Take 1 capsule (100 mg total) by mouth 2 (two) times daily. X 7 days Patient not taking: Reported on 07/12/2014 04/02/14   Gilda Crease, MD  OLANZapine (ZYPREXA) 5 MG tablet Take 1 tablet (5 mg total) by mouth at bedtime. Patient not taking: Reported on 07/12/2014 04/02/14   Rolland Porter, MD  OLANZapine zydis (ZYPREXA) 5 MG disintegrating tablet Take 1 tablet (5 mg total) by mouth 2 (two) times daily. Patient not taking: Reported on 07/12/2014 03/26/14   Shuvon Rankin, NP  predniSONE (DELTASONE) 20 MG tablet Take 2 tablets (40 mg total) by mouth daily. 07/20/14   Antony Madura, PA-C  QUEtiapine (SEROQUEL)  50 MG tablet Take 1 tablet (50 mg total) by mouth 2 (two) times daily. 07/23/14   Abigail Harris, PA-C   BP 157/110 mmHg  Pulse 85  Temp(Src) 98.3 F (36.8 C) (Oral)  Resp 18  SpO2 98%  LMP 07/04/2014   Physical Exam  Constitutional: She is oriented to person, place, and time. She appears well-developed and well-nourished. No distress.  HENT:  Head: Normocephalic and atraumatic.  Eyes: Conjunctivae and EOM are normal.  Neck: Neck supple. No tracheal deviation present.   Cardiovascular: Normal rate.   Pulmonary/Chest: Effort normal. No respiratory distress. She has rales.  Diffuse rales. Actively coughing during exam.  Musculoskeletal: Normal range of motion.  Neurological: She is alert and oriented to person, place, and time.  Skin: Skin is warm and dry.  Psychiatric: She has a normal mood and affect. Her behavior is normal.  Nursing note and vitals reviewed.   ED Course  Procedures (including critical care time) DIAGNOSTIC STUDIES: Oxygen Saturation is 98% on RA, normal by my interpretation.    COORDINATION OF CARE: 9:17 PM-Discussed treatment plan with pt at bedside and pt agreed to plan.     Labs Review Labs Reviewed - No data to display  Imaging Review No results found.   EKG Interpretation None      MDM   Final diagnoses:  None    1. COUGH 2. Tobacco abuse 3. H/o Bipolar  The patient reports anger management issues and feels her anger is out of control. She is requesting [facility] but denies SI or homicidal ideation. She has been unable to get her medications filled [place]. Will provide resources for outpatient services.  She has a persistent cough. Today is her 8th visit to the ED this month, 7 were EMS arrivals. Discussed appropriate use of the EMS system and potential for being charged with abuse. She has prescription for cough medication but reports no resources for getting it filled. Will offer to provide inhaler but "that does not work for me". Patient's VS stable - recheck blood pressure normal.    I personally performed the services described in this documentation, which was scribed in my presence. The recorded information has been reviewed and is accurate.       Arnoldo HookerShari A Alea Ryer, PA-C 07/25/14 2155  Enid SkeensJoshua M Zavitz, MD 07/26/14 [phone]

## 2014-07-27 ENCOUNTER — Encounter (HOSPITAL_COMMUNITY): Payer: Self-pay | Admitting: Emergency Medicine

## 2014-07-27 ENCOUNTER — Emergency Department (HOSPITAL_COMMUNITY)
Admission: EM | Admit: 2014-07-27 | Discharge: 2014-07-28 | Disposition: A | Discharge status: Home / Self Care | Payer: Medicare Other | Attending: Emergency Medicine | Admitting: Emergency Medicine

## 2014-07-27 DIAGNOSIS — R197 Diarrhea, unspecified: Secondary | ICD-10-CM | POA: Diagnosis not present

## 2014-07-27 DIAGNOSIS — Z21 Asymptomatic human immunodeficiency virus [HIV] infection status: Secondary | ICD-10-CM | POA: Diagnosis not present

## 2014-07-27 DIAGNOSIS — Z7952 Long term (current) use of systemic steroids: Secondary | ICD-10-CM | POA: Diagnosis not present

## 2014-07-27 DIAGNOSIS — N390 Urinary tract infection, site not specified: Secondary | ICD-10-CM | POA: Diagnosis not present

## 2014-07-27 DIAGNOSIS — R1110 Vomiting, unspecified: Secondary | ICD-10-CM | POA: Diagnosis not present

## 2014-07-27 DIAGNOSIS — Z79899 Other long term (current) drug therapy: Secondary | ICD-10-CM | POA: Diagnosis not present

## 2014-07-27 DIAGNOSIS — R05 Cough: Secondary | ICD-10-CM | POA: Diagnosis not present

## 2014-07-27 DIAGNOSIS — Z3202 Encounter for pregnancy test, result negative: Secondary | ICD-10-CM | POA: Insufficient documentation

## 2014-07-27 DIAGNOSIS — Z880 Allergy status to penicillin: Secondary | ICD-10-CM | POA: Insufficient documentation

## 2014-07-27 DIAGNOSIS — Z720 Tobacco use: Secondary | ICD-10-CM | POA: Insufficient documentation

## 2014-07-27 DIAGNOSIS — J449 Chronic obstructive pulmonary disease, unspecified: Secondary | ICD-10-CM | POA: Insufficient documentation

## 2014-07-27 DIAGNOSIS — F319 Bipolar disorder, unspecified: Secondary | ICD-10-CM | POA: Diagnosis not present

## 2014-07-27 DIAGNOSIS — R350 Frequency of micturition: Secondary | ICD-10-CM | POA: Diagnosis present

## 2014-07-27 DIAGNOSIS — R053 Chronic cough: Secondary | ICD-10-CM

## 2014-07-27 LAB — COMPREHENSIVE METABOLIC PANEL
ALBUMIN: 4 g/dL (ref 3.5–5.2)
ALK PHOS: 46 U/L (ref 39–117)
ALT: 16 U/L (ref 0–35)
ANION GAP: 11 (ref 5–15)
AST: 23 U/L (ref 0–37)
BUN: 14 mg/dL (ref 6–23)
CO2: 24 mmol/L (ref 19–32)
CREATININE: 0.69 mg/dL (ref 0.50–1.10)
Calcium: 9.2 mg/dL (ref 8.4–10.5)
Chloride: 104 mmol/L (ref 96–112)
GFR calc Af Amer: >90 mL/min (ref >90)
GFR calc non Af Amer: >90 mL/min (ref >90)
Glucose, Bld: 103 mg/dL — ABNORMAL HIGH (ref 70–99)
POTASSIUM: 3.6 mmol/L (ref 3.5–5.1)
Sodium: 139 mmol/L (ref 135–145)
Total Bilirubin: 0.4 mg/dL (ref 0.3–1.2)
Total Protein: 7.7 g/dL (ref 6.0–8.3)

## 2014-07-27 LAB — URINALYSIS, ROUTINE W REFLEX MICROSCOPIC
Bilirubin Urine: NEGATIVE
Glucose, UA: NEGATIVE mg/dL
Ketones, ur: NEGATIVE mg/dL
NITRITE: POSITIVE — AB
Protein, ur: 30 mg/dL — AB
SPECIFIC GRAVITY, URINE: 1.019 (ref 1.005–1.030)
Urobilinogen, UA: 0.2 mg/dL (ref 0.0–1.0)
pH: 7 (ref 5.0–8.0)

## 2014-07-27 LAB — URINE MICROSCOPIC-ADD ON

## 2014-07-27 LAB — CBC
HCT: 39.2 % (ref 36.0–46.0)
Hemoglobin: 12.7 g/dL (ref 12.0–15.0)
MCH: 26.4 pg (ref 26.0–34.0)
MCHC: 32.4 g/dL (ref 30.0–36.0)
MCV: 81.5 fL (ref 78.0–100.0)
PLATELETS: 319 10*3/uL (ref 150–400)
RBC: 4.81 MIL/uL (ref 3.87–5.11)
RDW: 13.7 % (ref 11.5–15.5)
WBC: 6.3 10*3/uL (ref 4.0–10.5)

## 2014-07-27 LAB — PREGNANCY, URINE: Preg Test, Ur: NEGATIVE

## 2014-07-27 MED ORDER — CIPROFLOXACIN IN D5W 400 MG/200ML IV SOLN
400.0000 mg | Freq: Two times a day (BID) | INTRAVENOUS | Status: DC
  Administered 2014-07-28: 400 mg via INTRAVENOUS
  Filled 2014-07-27: qty 200

## 2014-07-27 MED ORDER — ONDANSETRON HCL 4 MG/2ML IJ SOLN
4.0000 mg | INTRAMUSCULAR | Status: AC
  Administered 2014-07-27: 4 mg via INTRAVENOUS
  Filled 2014-07-27: qty 2

## 2014-07-27 MED ORDER — KETOROLAC TROMETHAMINE 30 MG/ML IJ SOLN
30.0000 mg | Freq: Once | INTRAMUSCULAR | Status: AC
  Administered 2014-07-27: 30 mg via INTRAVENOUS
  Filled 2014-07-27: qty 1

## 2014-07-27 MED ORDER — SODIUM CHLORIDE 0.9 % IV BOLUS (SEPSIS)
1000.0000 mL | Freq: Once | INTRAVENOUS | Status: AC
  Administered 2014-07-27: 1000 mL via INTRAVENOUS

## 2014-07-27 NOTE — ED Notes (Signed)
Pt brought in by EMS with c/o lower abd pain and frequent urination  Pt states she also has pain in her back and has had diarrhea for a couple days  Pt states she was shopping today and almost blacked out in the store   Pt states she has had a cough for several months as well

## 2014-07-27 NOTE — Progress Notes (Signed)
ANTIBIOTIC CONSULT NOTE - INITIAL  Pharmacy Consult for Cipro Indication: Pyelonephritis  Allergies  Allergen Reactions  . Penicillins Swelling    Patient Measurements:   Wt=59 kg  Vital Signs: Temp: 98.9 F (37.2 C) (02/25 2049) Temp Source: Oral (02/25 2049) BP: 138/89 mmHg (02/25 2315) Pulse Rate: 78 (02/25 2315) Intake/Output from previous day:   Intake/Output from this shift:    Labs:  Recent Labs  07/27/14 2113  WBC 6.3  HGB 12.7  PLT 319  CREATININE 0.69   Estimated Creatinine Clearance: 71.8 mL/min (by C-G formula based on Cr of 0.69). No results for input(s): VANCOTROUGH, VANCOPEAK, VANCORANDOM, GENTTROUGH, GENTPEAK, GENTRANDOM, TOBRATROUGH, TOBRAPEAK, TOBRARND, AMIKACINPEAK, AMIKACINTROU, AMIKACIN in the last 72 hours.   Microbiology: Recent Results (from the past 720 hour(s))  Rapid strep screen     Status: None   Collection Time: 07/10/14  9:50 PM  Result Value Ref Range Status   Streptococcus, Group A Screen (Direct) NEGATIVE NEGATIVE Final    Comment: (NOTE) A Rapid Antigen test may result negative if the antigen level in the sample is below the detection level of this test. The FDA has not cleared this test as a stand-alone test therefore the rapid antigen negative result has reflexed to a Group A Strep culture.   Culture, Group A Strep     Status: None   Collection Time: 07/10/14  9:50 PM  Result Value Ref Range Status   Specimen Description THROAT  Final   Special Requests ADDED 2226  Final   Culture   Final    No Beta Hemolytic Streptococci Isolated Performed at [facility]    Report Status 07/12/2014 FINAL  Final    Medical History: Past Medical History  Diagnosis Date  . Bipolar 1 disorder   . HIV antibody positive   . COPD (chronic obstructive pulmonary disease)   . Asthma     Medications:  Scheduled:   Infusions:  . ciprofloxacin     Assessment: [age] yoF c/o lower abdominal pain and frequent urination.  Cipro  per Rx for pyelonephritis.   Goal of Therapy:  Treat pyelo  Plan:   Cipro 400mg  IV q12h  F/U scr/cultures  Lorenza EvangelistGreen, Usher Hedberg R 07/27/2014,11:43 PM

## 2014-07-27 NOTE — ED Provider Notes (Signed)
CSN: [id number]     Arrival date & time 07/27/14  2014 History  This chart was scribed for non-physician practitioner, Antony Madura, PA-C, working with Hanley Seamen, MD, by Roxy Cedar ED Scribe. This patient was seen in room WA15/WA15 and the patient's care was started at 11:15 PM    Chief Complaint  Patient presents with  . Urinary Frequency  . Abdominal Pain   Patient is a 54 y.o. female presenting with frequency and abdominal pain. The history is provided by the patient. No language interpreter was used.  Urinary Frequency Associated symptoms include abdominal pain.  Abdominal Pain Associated symptoms: diarrhea, dysuria and vomiting     HPI Comments: Natalie Barrett is a 54 y.o. female with a PMHx of bipolar disorder, HIV antibody positive, COPD and asthma, who presents to the Emergency Department complaining of increased urinary frequency, dysuria and midline lower back pain. She also reports associated watery stool diarrhea. She reports onset of dysuria 2 weeks ago. She states she takes azo tablets and last took them yesterday night. She reports 1 episode of emesis earlier today. She states that she has an upcoming appointment scheduled 1 month from now with specialist. She denies associated hematochezia. Patient also reports 1 near syncope episode while she was at the store earlier today.   Past Medical History  Diagnosis Date  . Bipolar 1 disorder   . HIV antibody positive   . COPD (chronic obstructive pulmonary disease)   . Asthma    Past Surgical History  Procedure Laterality Date  . Cesarean section    . Tubal ligation     Family History  Problem Relation Age of Onset  . Cancer Other    History  Substance Use Topics  . Smoking status: Current Some Day Smoker    Types: Cigarettes  . Smokeless tobacco: Never Used  . Alcohol Use: No   OB History    No data available      Review of Systems  Gastrointestinal: Positive for vomiting, abdominal pain and diarrhea.   Genitourinary: Positive for dysuria and frequency.  Musculoskeletal: Positive for back pain.  All other systems reviewed and are negative.   Allergies  Penicillins  Home Medications   Prior to Admission medications   Medication Sig Start Date End Date Taking? Authorizing Provider  Benzocaine-Pectin 15-5 MG LOZG Use as directed 1 lozenge in the mouth or throat every 2 (two) hours as needed. Patient not taking: Reported on 07/12/2014 07/10/14   Ames Dura, MD  ciprofloxacin (CIPRO) 500 MG tablet Take 1 tablet (500 mg total) by mouth 2 (two) times daily. 07/28/14   Antony Madura, PA-C  dicyclomine (BENTYL) 20 MG tablet Take 1 tablet (20 mg total) by mouth 2 (two) times daily. Patient not taking: Reported on 07/12/2014 04/02/14   Gilda Crease, MD  elvitegravir-cobicistat-emtricitabine-tenofovir (STRIBILD) 150-150-200-300 MG TABS tablet Take 1 tablet by mouth daily with breakfast. Patient not taking: Reported on 07/27/2014 07/23/14   Arthor Captain, PA-C  guaiFENesin-codeine 100-10 MG/5ML syrup Take 5-10 mLs by mouth every 6 (six) hours as needed for cough. Patient not taking: Reported on 07/25/2014 07/23/14   Arthor Captain, PA-C  nitrofurantoin, macrocrystal-monohydrate, (MACROBID) 100 MG capsule Take 1 capsule (100 mg total) by mouth 2 (two) times daily with a meal. Patient not taking: Reported on 07/12/2014 03/26/14   Shuvon Rankin, NP  nitrofurantoin, macrocrystal-monohydrate, (MACROBID) 100 MG capsule Take 1 capsule (100 mg total) by mouth 2 (two) times daily. X 7 days Patient not taking:  Reported on 07/12/2014 04/02/14   Gilda Crease, MD  OLANZapine (ZYPREXA) 5 MG tablet Take 1 tablet (5 mg total) by mouth at bedtime. Patient not taking: Reported on 07/12/2014 04/02/14   Rolland Porter, MD  OLANZapine zydis (ZYPREXA) 5 MG disintegrating tablet Take 1 tablet (5 mg total) by mouth 2 (two) times daily. Patient not taking: Reported on 07/12/2014 03/26/14   Shuvon Rankin, NP  predniSONE  (DELTASONE) 20 MG tablet Take 2 tablets (40 mg total) by mouth daily. Patient not taking: Reported on 07/25/2014 07/20/14   Antony Madura, PA-C  QUEtiapine (SEROQUEL) 50 MG tablet Take 1 tablet (50 mg total) by mouth 2 (two) times daily. Patient not taking: Reported on 07/27/2014 07/23/14   Arthor Captain, PA-C   Triage Vitals: BP 157/88 mmHg  Pulse 96  Temp(Src) 98.9 F (37.2 C) (Oral)  Resp 18  SpO2 95%  LMP 07/04/2014 (Approximate)  Physical Exam  Constitutional: She is oriented to person, place, and time. She appears well-developed and well-nourished. No distress.  Nontoxic/nonseptic appearing  HENT:  Head: Normocephalic and atraumatic.  Eyes: Conjunctivae and EOM are normal. No scleral icterus.  Neck: Normal range of motion.  Cardiovascular: Normal rate, regular rhythm and normal heart sounds.   Pulmonary/Chest: Effort normal and breath sounds normal. No respiratory distress. She has no wheezes. She has no rales.  Respirations even and unlabored. Lungs clear. There is a dry cough appreciated at bedside sporadically  Abdominal: Soft. She exhibits no distension. There is tenderness. There is no rebound and no guarding.  Mild suprapubic tenderness. No peritoneal signs. Abdomen soft.  Musculoskeletal: Normal range of motion. She exhibits tenderness.  Tenderness to palpation to bilateral lumbar paraspinal muscles. No deformities, step-offs, or crepitus lumbar spine  Neurological: She is alert and oriented to person, place, and time.  Skin: Skin is warm and dry. No rash noted. She is not diaphoretic. No erythema. No pallor.  Psychiatric: She has a normal mood and affect. Her behavior is normal.  Nursing note and vitals reviewed.   ED Course  Procedures (including critical care time)  DIAGNOSTIC STUDIES: Oxygen Saturation is 95% on RA, normal by my interpretation.    COORDINATION OF CARE: 11:18 PM- Discussed plans to order diagnostic lab work and urinalysis. Will give patient IV  fluids, Toradol and Zofran. Pt advised of plan for treatment and pt agrees.  Labs Review Labs Reviewed  URINALYSIS, ROUTINE W REFLEX MICROSCOPIC - Abnormal; Notable for the following:    APPearance CLOUDY (*)    Hgb urine dipstick TRACE (*)    Protein, ur 30 (*)    Nitrite POSITIVE (*)    Leukocytes, UA SMALL (*)    All other components within normal limits  COMPREHENSIVE METABOLIC PANEL - Abnormal; Notable for the following:    Glucose, Bld 103 (*)    All other components within normal limits  URINE MICROSCOPIC-ADD ON - Abnormal; Notable for the following:    Bacteria, UA MANY (*)    All other components within normal limits  URINE CULTURE  PREGNANCY, URINE  CBC   Imaging Review No results found.   EKG Interpretation None     MDM   Final diagnoses:  UTI (lower urinary tract infection)  Chronic cough    Pt has been diagnosed with a UTI. Pt is afebrile, normotensive, and nontoxic/nonseptic appearing. Abdomen with mild suprapubic TTP; this is stable on reexamination. Abdomen soft. No peritoneal signs. IV Cipro given in ED. Will manage as outpatient with same. She reports  1 episode of vomiting and diarrhea. Question early viral gastroenteritis. Doubt emergent abdominal pelvic process given reassuring laboratory workup. She has been seen in the ED multiple times for various complaints including a persistent cough. Prior CXRs reviewed which are unremarkable. Pt to be dc home with antibiotics and instructions to follow up with a PCP if symptoms persist. Return precautions given. Patient discharged in good condition; VSS.  I personally performed the services described in this documentation, which was scribed in my presence. The recorded information has been reviewed and is accurate.   Filed Vitals:   07/27/14 2315 07/28/14 0024 07/28/14 0143 07/28/14 0347  BP: 138/89 139/77 121/69 103/60  Pulse: 78 76 83 80  Temp:      TempSrc:      Resp: 18 18 18 18   SpO2: 100% 99% 95% 95%      Antony MaduraKelly Lera Gaines, PA-C 07/28/14 0539  Hanley SeamenJohn L Molpus, MD 07/28/14 [phone]

## 2014-07-28 ENCOUNTER — Emergency Department (HOSPITAL_COMMUNITY)
Admission: EM | Admit: 2014-07-28 | Discharge: 2014-07-28 | Disposition: A | Discharge status: Home / Self Care | Payer: Medicare Other | Source: Home / Self Care | Attending: Emergency Medicine | Admitting: Emergency Medicine

## 2014-07-28 ENCOUNTER — Encounter (HOSPITAL_COMMUNITY): Payer: Self-pay | Admitting: Emergency Medicine

## 2014-07-28 DIAGNOSIS — Z880 Allergy status to penicillin: Secondary | ICD-10-CM

## 2014-07-28 DIAGNOSIS — J449 Chronic obstructive pulmonary disease, unspecified: Secondary | ICD-10-CM | POA: Insufficient documentation

## 2014-07-28 DIAGNOSIS — F319 Bipolar disorder, unspecified: Secondary | ICD-10-CM | POA: Insufficient documentation

## 2014-07-28 DIAGNOSIS — N390 Urinary tract infection, site not specified: Secondary | ICD-10-CM | POA: Insufficient documentation

## 2014-07-28 DIAGNOSIS — Z7952 Long term (current) use of systemic steroids: Secondary | ICD-10-CM | POA: Insufficient documentation

## 2014-07-28 DIAGNOSIS — Z7982 Long term (current) use of aspirin: Secondary | ICD-10-CM

## 2014-07-28 DIAGNOSIS — Z720 Tobacco use: Secondary | ICD-10-CM

## 2014-07-28 DIAGNOSIS — R1110 Vomiting, unspecified: Secondary | ICD-10-CM | POA: Insufficient documentation

## 2014-07-28 DIAGNOSIS — Z79899 Other long term (current) drug therapy: Secondary | ICD-10-CM

## 2014-07-28 MED ORDER — ONDANSETRON 4 MG PO TBDP: ORAL_TABLET | ORAL | Status: AC

## 2014-07-28 MED ORDER — PROMETHAZINE HCL 25 MG/ML IJ SOLN
25.0000 mg | Freq: Once | INTRAMUSCULAR | Status: AC
  Administered 2014-07-28: 25 mg via INTRAMUSCULAR
  Filled 2014-07-28: qty 1

## 2014-07-28 MED ORDER — CIPROFLOXACIN HCL 500 MG PO TABS: 500.0000 mg | ORAL_TABLET | Freq: Two times a day (BID) | ORAL | Status: AC

## 2014-07-28 NOTE — ED Notes (Signed)
Pt has all belongings upon dc

## 2014-07-28 NOTE — ED Notes (Signed)
Pt states she was seen and treated earlier tonight for a UTI and was given Cipro IV  Pt states she told them that she could not tolerate it and once she was discharged she went to the bathroom in the ED lobby and vomited and had diarrhea all over the bathroom floor  Pt states she had a near syncopal episode while she was in the bathroom  Pt is agitated in words and actions in triage

## 2014-07-28 NOTE — Discharge Instructions (Signed)
Follow up with your md in one week 

## 2014-07-28 NOTE — Progress Notes (Signed)
  CARE MANAGEMENT ED NOTE 07/28/2014  Patient:  Natalie Barrett,Natalie Barrett   Account Number:  [id number]  Date Initiated:  07/28/2014  Documentation initiated by:  Edd ArbourGIBBS,Karely Hurtado  Subjective/Objective Assessment:   54 yr old medicare homeless [place] pt.  Pt seen by nursing staff sleeping in lobby at 0745 c/o emesis, vomiting PMH bipolar 1 disorder, HIV antibody positive, COPD, asthma Vomiting improved,   Will d/c with zofran and she will take     Subjective/Objective Assessment Detail:   the antibiotic for the uti      no pcp  states she has no pcp Refuses pcp services  [facility]  Cm confirmed pt zip code as [phone] Pt states this zip code is where she gets her mail. Reports she is living on the street  pt screaming for CM to leave her room when self pay resources offered States she was wasting her time and had [facility] [employer] [facility] and had to go pick it up Reported she was wasting her time  Security came to pt room Pt given list of self pay resources that include medicare providers but unable to go over them because pt still screaming Pt states she is familiar with [facility] Cm inquired if she was familiar with [facility] connected with [facility] Pt stated no CM offered resources Pt refused     Action/Plan:   Cm noted pt with 13 ED visits in last 6 months Cm inquired which pcp pt was following up Pt states she has no pcp & confirms her medicare coverage Cm informed her that she could provide her with a list of pcp within zip code [phone] for f/u   Action/Plan Detail:   care CM informed pt to stop raising her voice at her Discussed [facility] possible for f/u for continued issues. security escorted pt to ED exit   Anticipated DC Date:  07/28/2014     Status Recommendation to Physician:   Result of Recommendation:    Other ED Services  Consult Working Plan    DC Planning Services  Other  Outpatient Services - Pt will follow  up  PCP issues    Choice offered to / List presented to:            Status of service:  Completed, signed off  ED Comments:   ED Comments Detail:

## 2014-07-28 NOTE — ED Notes (Signed)
Pt denied questions or concerns r/t dc.

## 2014-07-28 NOTE — ED Notes (Signed)
Pt request to be discharged asap.

## 2014-07-28 NOTE — ED Notes (Signed)
Pt visualized sleeping in the lobby in NAD.  Has not reported any NVD to this nurse.  Walked back to room with no assistance.

## 2014-07-28 NOTE — Discharge Instructions (Signed)
Urinary Tract Infection  Urinary tract infections (UTIs) can develop anywhere along your urinary tract. Your urinary tract is your body's drainage system for removing wastes and extra water. Your urinary tract includes two kidneys, two ureters, a bladder, and a urethra. Your kidneys are a pair of bean-shaped organs. Each kidney is about the size of your fist. They are located below your ribs, one on each side of your spine.  CAUSES  Infections are caused by microbes, which are microscopic organisms, including fungi, viruses, and bacteria. These organisms are so small that they can only be seen through a microscope. Bacteria are the microbes that most commonly cause UTIs.  SYMPTOMS   Symptoms of UTIs may vary by age and gender of the patient and by the location of the infection. Symptoms in young women typically include a frequent and intense urge to urinate and a painful, burning feeling in the bladder or urethra during urination. Older women and men are more likely to be tired, shaky, and weak and have muscle aches and abdominal pain. A fever may mean the infection is in your kidneys. Other symptoms of a kidney infection include pain in your back or sides below the ribs, nausea, and vomiting.  DIAGNOSIS  To diagnose a UTI, your caregiver will ask you about your symptoms. Your caregiver also will ask to provide a urine sample. The urine sample will be tested for bacteria and white blood cells. White blood cells are made by your body to help fight infection.  TREATMENT   Typically, UTIs can be treated with medication. Because most UTIs are caused by a bacterial infection, they usually can be treated with the use of antibiotics. The choice of antibiotic and length of treatment depend on your symptoms and the type of bacteria causing your infection.  HOME CARE INSTRUCTIONS   If you were prescribed antibiotics, take them exactly as your caregiver instructs you. Finish the medication even if you feel better after you  have only taken some of the medication.   Drink enough water and fluids to keep your urine clear or pale yellow.   Avoid caffeine, tea, and carbonated beverages. They tend to irritate your bladder.   Empty your bladder often. Avoid holding urine for long periods of time.   Empty your bladder before and after sexual intercourse.   After a bowel movement, women should cleanse from front to back. Use each tissue only once.  SEEK MEDICAL CARE IF:    You have back pain.   You develop a fever.   Your symptoms do not begin to resolve within 3 days.  SEEK IMMEDIATE MEDICAL CARE IF:    You have severe back pain or lower abdominal pain.   You develop chills.   You have nausea or vomiting.   You have continued burning or discomfort with urination.  MAKE SURE YOU:    Understand these instructions.   Will watch your condition.   Will get help right away if you are not doing well or get worse.  Document Released: 02/26/2005 Document Revised: 11/18/2011 Document Reviewed: 06/27/2011  [employer] Patient Information 2015 [employer]. This information is not intended to replace advice given to you by your health care provider. Make sure you discuss any questions you have with your health care provider.    Cough, Adult   A cough is a reflex that helps clear your throat and airways. It can help heal the body or may be a reaction to an irritated airway. A cough   may only last 2 or 3 weeks (acute) or may last more than 8 weeks (chronic).   CAUSES  Acute cough:   Viral or bacterial infections.  Chronic cough:   Infections.   Allergies.   Asthma.   Post-nasal drip.   Smoking.   Heartburn or acid reflux.   Some medicines.   Chronic lung problems (COPD).   Cancer.  SYMPTOMS    Cough.   Fever.   Chest pain.   Increased breathing rate.   High-pitched whistling sound when breathing (wheezing).   Colored mucus that you cough up (sputum).  TREATMENT    A bacterial cough may be treated with antibiotic  medicine.   A viral cough must run its course and will not respond to antibiotics.   Your caregiver may recommend other treatments if you have a chronic cough.  HOME CARE INSTRUCTIONS    Only take over-the-counter or prescription medicines for pain, discomfort, or fever as directed by your caregiver. Use cough suppressants only as directed by your caregiver.   Use a cold steam vaporizer or humidifier in your bedroom or home to help loosen secretions.   Sleep in a semi-upright position if your cough is worse at night.   Rest as needed.   Stop smoking if you smoke.  SEEK IMMEDIATE MEDICAL CARE IF:    You have pus in your sputum.   Your cough starts to worsen.   You cannot control your cough with suppressants and are losing sleep.   You begin coughing up blood.   You have difficulty breathing.   You develop pain which is getting worse or is uncontrolled with medicine.   You have a fever.  MAKE SURE YOU:    Understand these instructions.   Will watch your condition.   Will get help right away if you are not doing well or get worse.  Document Released: 11/15/2010 Document Revised: 08/11/2011 Document Reviewed: 11/15/2010  [employer] Patient Information 2015 [employer]. This information is not intended to replace advice given to you by your health care provider. Make sure you discuss any questions you have with your health care provider.

## 2014-07-28 NOTE — ED Provider Notes (Signed)
CSN: [id number]     Arrival date & time 07/28/14  0446 History   First MD Initiated Contact with Patient 07/28/14 [phone]     Chief Complaint  Patient presents with  . Emesis     (Consider location/radiation/quality/duration/timing/severity/associated sxs/prior Treatment) Patient is a 54 y.o. female presenting with vomiting. The history is provided by the patient (the pt was dx with uti yesterday.  she vomited once and returned to the er).  Emesis Severity:  Mild Timing:  Intermittent Quality:  Stomach contents Able to tolerate:  Liquids Onset of vomiting after eating: unknown. Progression:  Improving Chronicity:  New Associated symptoms: no abdominal pain, no diarrhea and no headaches     Past Medical History  Diagnosis Date  . Bipolar 1 disorder   . HIV antibody positive   . COPD (chronic obstructive pulmonary disease)   . Asthma    Past Surgical History  Procedure Laterality Date  . Cesarean section    . Tubal ligation     Family History  Problem Relation Age of Onset  . Cancer Other    History  Substance Use Topics  . Smoking status: Current Some Day Smoker    Types: Cigarettes  . Smokeless tobacco: Never Used  . Alcohol Use: No   OB History    No data available     Review of Systems  Constitutional: Negative for appetite change and fatigue.  HENT: Negative for congestion, ear discharge and sinus pressure.   Eyes: Negative for discharge.  Respiratory: Negative for cough.   Cardiovascular: Negative for chest pain.  Gastrointestinal: Positive for vomiting. Negative for abdominal pain and diarrhea.  Genitourinary: Negative for frequency and hematuria.  Musculoskeletal: Negative for back pain.  Skin: Negative for rash.  Neurological: Negative for seizures and headaches.  Psychiatric/Behavioral: Negative for hallucinations.      Allergies  Ciprofloxacin and Penicillins  Home Medications   Prior to Admission medications   Medication Sig Start Date End  Date Taking? Authorizing Provider  albuterol (PROVENTIL HFA;VENTOLIN HFA) 108 (90 BASE) MCG/ACT inhaler Inhale 1-2 puffs into the lungs every 6 (six) hours as needed for wheezing or shortness of breath.   Yes Historical Provider, MD  Benzocaine-Pectin 15-5 MG LOZG Use as directed 1 lozenge in the mouth or throat every 2 (two) hours as needed. Patient not taking: Reported on 07/12/2014 07/10/14   Ames Dura, MD  ciprofloxacin (CIPRO) 500 MG tablet Take 1 tablet (500 mg total) by mouth 2 (two) times daily. Patient not taking: Reported on 07/28/2014 07/28/14   Antony Madura, PA-C  dicyclomine (BENTYL) 20 MG tablet Take 1 tablet (20 mg total) by mouth 2 (two) times daily. Patient not taking: Reported on 07/12/2014 04/02/14   Gilda Crease, MD  elvitegravir-cobicistat-emtricitabine-tenofovir (STRIBILD) 150-150-200-300 MG TABS tablet Take 1 tablet by mouth daily with breakfast. Patient not taking: Reported on 07/27/2014 07/23/14   Arthor Captain, PA-C  guaiFENesin-codeine 100-10 MG/5ML syrup Take 5-10 mLs by mouth every 6 (six) hours as needed for cough. Patient not taking: Reported on 07/25/2014 07/23/14   Arthor Captain, PA-C  nitrofurantoin, macrocrystal-monohydrate, (MACROBID) 100 MG capsule Take 1 capsule (100 mg total) by mouth 2 (two) times daily with a meal. Patient not taking: Reported on 07/12/2014 03/26/14   Shuvon Rankin, NP  nitrofurantoin, macrocrystal-monohydrate, (MACROBID) 100 MG capsule Take 1 capsule (100 mg total) by mouth 2 (two) times daily. X 7 days Patient not taking: Reported on 07/12/2014 04/02/14   Gilda Crease, MD  OLANZapine [facility]) 5  MG tablet Take 1 tablet (5 mg total) by mouth at bedtime. Patient not taking: Reported on 07/12/2014 04/02/14   Rolland PorterMark James, MD  OLANZapine zydis (ZYPREXA) 5 MG disintegrating tablet Take 1 tablet (5 mg total) by mouth 2 (two) times daily. Patient not taking: Reported on 07/12/2014 03/26/14   Shuvon Rankin, NP  ondansetron (ZOFRAN ODT) 4  MG disintegrating tablet 4mg  ODT q4 hours prn nausea/vomit 07/28/14   Benny LennertJoseph L Javia Dillow, MD  predniSONE (DELTASONE) 20 MG tablet Take 2 tablets (40 mg total) by mouth daily. Patient not taking: Reported on 07/25/2014 07/20/14   Antony MaduraKelly Humes, PA-C  QUEtiapine (SEROQUEL) 50 MG tablet Take 1 tablet (50 mg total) by mouth 2 (two) times daily. Patient not taking: Reported on 07/27/2014 07/23/14   Arthor CaptainAbigail Harris, PA-C   BP 123/62 mmHg  Pulse 75  Temp(Src) 98.4 F (36.9 C) (Oral)  Resp 16  SpO2 95%  LMP 07/04/2014 (Approximate) Physical Exam  Constitutional: She is oriented to person, place, and time. She appears well-developed.  HENT:  Head: Normocephalic.  Eyes: Conjunctivae and EOM are normal. No scleral icterus.  Neck: Neck supple. No thyromegaly present.  Cardiovascular: Normal rate and regular rhythm.  Exam reveals no gallop and no friction rub.   No murmur heard. Pulmonary/Chest: No stridor. She has no wheezes. She has no rales. She exhibits no tenderness.  Abdominal: She exhibits no distension. There is no tenderness. There is no rebound.  Musculoskeletal: Normal range of motion. She exhibits no edema.  Lymphadenopathy:    She has no cervical adenopathy.  Neurological: She is oriented to person, place, and time. She exhibits normal muscle tone. Coordination normal.  Skin: No rash noted. No erythema.  Psychiatric: She has a normal mood and affect. Her behavior is normal.    ED Course  Procedures (including critical care time) Labs Review Labs Reviewed - No data to display  Imaging Review No results found.   EKG Interpretation None      MDM   Final diagnoses:  UTI (lower urinary tract infection)  Acute vomiting    Vomiting improved,   Will d/c with zofran and she will take the antibiotic for the uti       Benny LennertJoseph L Jettie Lazare, MD 07/28/14 [phone]

## 2014-07-28 NOTE — ED Notes (Signed)
Patient is not currently agreeable to plan of care. (changing into gown and VS)

## 2014-07-29 ENCOUNTER — Encounter (HOSPITAL_COMMUNITY): Payer: Self-pay | Admitting: Emergency Medicine

## 2014-07-29 ENCOUNTER — Emergency Department (HOSPITAL_COMMUNITY)
Admission: EM | Admit: 2014-07-29 | Discharge: 2014-07-29 | Disposition: A | Discharge status: Home / Self Care | Payer: Medicare Other | Attending: Emergency Medicine | Admitting: Emergency Medicine

## 2014-07-29 DIAGNOSIS — R220 Localized swelling, mass and lump, head: Secondary | ICD-10-CM | POA: Diagnosis present

## 2014-07-29 DIAGNOSIS — Z880 Allergy status to penicillin: Secondary | ICD-10-CM | POA: Diagnosis not present

## 2014-07-29 DIAGNOSIS — Z21 Asymptomatic human immunodeficiency virus [HIV] infection status: Secondary | ICD-10-CM | POA: Diagnosis not present

## 2014-07-29 DIAGNOSIS — J449 Chronic obstructive pulmonary disease, unspecified: Secondary | ICD-10-CM | POA: Insufficient documentation

## 2014-07-29 DIAGNOSIS — Z87440 Personal history of urinary (tract) infections: Secondary | ICD-10-CM | POA: Diagnosis not present

## 2014-07-29 DIAGNOSIS — H02842 Edema of right lower eyelid: Secondary | ICD-10-CM | POA: Insufficient documentation

## 2014-07-29 DIAGNOSIS — Z720 Tobacco use: Secondary | ICD-10-CM | POA: Insufficient documentation

## 2014-07-29 DIAGNOSIS — Z7952 Long term (current) use of systemic steroids: Secondary | ICD-10-CM | POA: Diagnosis not present

## 2014-07-29 DIAGNOSIS — F319 Bipolar disorder, unspecified: Secondary | ICD-10-CM | POA: Diagnosis not present

## 2014-07-29 DIAGNOSIS — Z79899 Other long term (current) drug therapy: Secondary | ICD-10-CM | POA: Diagnosis not present

## 2014-07-29 DIAGNOSIS — H02841 Edema of right upper eyelid: Secondary | ICD-10-CM | POA: Insufficient documentation

## 2014-07-29 DIAGNOSIS — H02845 Edema of left lower eyelid: Secondary | ICD-10-CM | POA: Insufficient documentation

## 2014-07-29 DIAGNOSIS — H02844 Edema of left upper eyelid: Secondary | ICD-10-CM | POA: Insufficient documentation

## 2014-07-29 DIAGNOSIS — H5789 Other specified disorders of eye and adnexa: Secondary | ICD-10-CM

## 2014-07-29 MED ORDER — LORATADINE 10 MG PO TABS: 10.0000 mg | ORAL_TABLET | Freq: Every day | ORAL | Status: AC

## 2014-07-29 MED ORDER — DIPHENHYDRAMINE HCL 25 MG PO TABS: 25.0000 mg | ORAL_TABLET | Freq: Four times a day (QID) | ORAL | Status: AC

## 2014-07-29 MED ORDER — DIPHENHYDRAMINE HCL 25 MG PO CAPS
25.0000 mg | ORAL_CAPSULE | Freq: Once | ORAL | Status: AC
  Administered 2014-07-29: 25 mg via ORAL
  Filled 2014-07-29: qty 1

## 2014-07-29 NOTE — ED Notes (Signed)
Case Manager -[place], @ [phone]. Will see pt in room to assist with medication payment

## 2014-07-29 NOTE — ED Notes (Signed)
Pt was given $3.00 for medications

## 2014-07-29 NOTE — ED Notes (Signed)
She states she was recently seen and treated here for u.t.i.  Today she c/o periorbital swelling which began this morning.  She theorizes it may be a reaction to her meds.  She denies itching, nor any tongue or throat swelling or fullness.  She is in no distress.

## 2014-07-29 NOTE — ED Notes (Signed)
Bed: WTR6 Expected date:  Expected time:  Means of arrival:  Comments: 

## 2014-07-29 NOTE — ED Notes (Signed)
Pt was seen by case manager

## 2014-07-29 NOTE — ED Provider Notes (Signed)
CSN: [id number]     Arrival date & time 07/29/14  9562 History   First MD Initiated Contact with Patient 07/29/14 1007     Chief Complaint  Patient presents with  . Facial Swelling     (Consider location/radiation/quality/duration/timing/severity/associated sxs/prior Treatment) HPI Patient here with periorbital swelling to eyelids bilaterally.  Of note patient was recently seen for UTI 2 days ago, returned and checked in again for one episode of vomiting, was seen a second time given a shot of Phenergan for nausea. Patient states the shot of nausea was yesterday, and she believes she is having some eyelid swelling secondary to the nausea medication. Patient states she has not filled or taken any of her prescribed antibiotics for her UTI. Patient denies rash, fever, erythema, eye pain or discomfort, dysphagia, oral swelling, shortness of breath, nausea, vomiting.  Past Medical History  Diagnosis Date  . Bipolar 1 disorder   . HIV antibody positive   . COPD (chronic obstructive pulmonary disease)   . Asthma    Past Surgical History  Procedure Laterality Date  . Cesarean section    . Tubal ligation     Family History  Problem Relation Age of Onset  . Cancer Other    History  Substance Use Topics  . Smoking status: Current Some Day Smoker    Types: Cigarettes  . Smokeless tobacco: Never Used  . Alcohol Use: No   OB History    No data available     Review of Systems  Constitutional: Negative for fever.  HENT: Negative for trouble swallowing.        Eyelid swelling  Eyes: Negative for visual disturbance.  Respiratory: Negative for shortness of breath.   Cardiovascular: Negative for chest pain.  Gastrointestinal: Negative for nausea, vomiting and abdominal pain.  Genitourinary: Negative for dysuria.  Musculoskeletal: Negative for neck pain.  Skin: Negative for rash.  Neurological: Negative for dizziness, weakness and numbness.  Psychiatric/Behavioral: Negative.        Allergies  Ciprofloxacin and Penicillins  Home Medications   Prior to Admission medications   Medication Sig Start Date End Date Taking? Authorizing Provider  albuterol (PROVENTIL HFA;VENTOLIN HFA) 108 (90 BASE) MCG/ACT inhaler Inhale 1-2 puffs into the lungs every 6 (six) hours as needed for wheezing or shortness of breath.   Yes Historical Provider, MD  Benzocaine-Pectin 15-5 MG LOZG Use as directed 1 lozenge in the mouth or throat every 2 (two) hours as needed. Patient not taking: Reported on 07/12/2014 07/10/14   Ames Dura, MD  ciprofloxacin (CIPRO) 500 MG tablet Take 1 tablet (500 mg total) by mouth 2 (two) times daily. Patient not taking: Reported on 07/28/2014 07/28/14   Antony Madura, PA-C  dicyclomine (BENTYL) 20 MG tablet Take 1 tablet (20 mg total) by mouth 2 (two) times daily. Patient not taking: Reported on 07/12/2014 04/02/14   Gilda Crease, MD  diphenhydrAMINE (BENADRYL) 25 MG tablet Take 1 tablet (25 mg total) by mouth every 6 (six) hours. 07/29/14   Monte Fantasia, PA-C  elvitegravir-cobicistat-emtricitabine-tenofovir (STRIBILD) 150-150-200-300 MG TABS tablet Take 1 tablet by mouth daily with breakfast. Patient not taking: Reported on 07/27/2014 07/23/14   Arthor Captain, PA-C  guaiFENesin-codeine 100-10 MG/5ML syrup Take 5-10 mLs by mouth every 6 (six) hours as needed for cough. Patient not taking: Reported on 07/25/2014 07/23/14   Arthor Captain, PA-C  loratadine (CLARITIN) 10 MG tablet Take 1 tablet (10 mg total) by mouth daily. One po daily x 5 days 07/29/14  Monte FantasiaJoseph W Ludger Bones, PA-C  nitrofurantoin, macrocrystal-monohydrate, (MACROBID) 100 MG capsule Take 1 capsule (100 mg total) by mouth 2 (two) times daily with a meal. Patient not taking: Reported on 07/12/2014 03/26/14   Shuvon Rankin, NP  nitrofurantoin, macrocrystal-monohydrate, (MACROBID) 100 MG capsule Take 1 capsule (100 mg total) by mouth 2 (two) times daily. X 7 days Patient not taking: Reported on  07/12/2014 04/02/14   Gilda Creasehristopher J. Pollina, MD  OLANZapine (ZYPREXA) 5 MG tablet Take 1 tablet (5 mg total) by mouth at bedtime. Patient not taking: Reported on 07/12/2014 04/02/14   Rolland PorterMark James, MD  OLANZapine zydis (ZYPREXA) 5 MG disintegrating tablet Take 1 tablet (5 mg total) by mouth 2 (two) times daily. Patient not taking: Reported on 07/12/2014 03/26/14   Shuvon Rankin, NP  ondansetron (ZOFRAN ODT) 4 MG disintegrating tablet 4mg  ODT q4 hours prn nausea/vomit Patient not taking: Reported on 07/29/2014 07/28/14   Benny LennertJoseph L Zammit, MD  predniSONE (DELTASONE) 20 MG tablet Take 2 tablets (40 mg total) by mouth daily. Patient not taking: Reported on 07/25/2014 07/20/14   Antony MaduraKelly Humes, PA-C  QUEtiapine (SEROQUEL) 50 MG tablet Take 1 tablet (50 mg total) by mouth 2 (two) times daily. Patient not taking: Reported on 07/27/2014 07/23/14   Arthor CaptainAbigail Harris, PA-C   BP 137/86 mmHg  Pulse 77  Temp(Src) 98 F (36.7 C) (Oral)  Resp 18  SpO2 100%  LMP 07/04/2014 (Approximate) Physical Exam  Constitutional: She is oriented to person, place, and time. She appears well-developed and well-nourished. No distress.  HENT:  Head: Normocephalic and atraumatic.  Mouth/Throat: Oropharynx is clear and moist. No oropharyngeal exudate.  Eyes: Conjunctivae and EOM are normal. Pupils are equal, round, and reactive to light. Right eye exhibits no discharge. Left eye exhibits no discharge. No scleral icterus.  Mild amount of swelling noted to upper and lower eyelids bilaterally. No erythema, induration, warmth, signs of infection noted. No discharge noted. EOMs intact, pupils Perl. No consensual photophobia  Neck: Normal range of motion.  Cardiovascular: Normal rate, regular rhythm and normal heart sounds.   No murmur heard. Pulmonary/Chest: Effort normal and breath sounds normal. No respiratory distress.  Abdominal: Soft. There is no tenderness.  Musculoskeletal: Normal range of motion. She exhibits no edema or tenderness.   Neurological: She is alert and oriented to person, place, and time. No cranial nerve deficit. Coordination normal.  Skin: Skin is warm and dry. No rash noted. She is not diaphoretic.  Psychiatric: She has a normal mood and affect.  Nursing note and vitals reviewed.   ED Course  Procedures (including critical care time) Labs Review Labs Reviewed - No data to display  Imaging Review No results found.   EKG Interpretation None      MDM   Final diagnoses:  Periorbital swelling    Patient here with periorbital swelling to eyelids bilaterally. No rash no erythema no signs of anaphylaxis, airway compromise. Patient instructed to use Benadryl, Claritin, ice to help reduce swelling. No concern for conjunctivitis, no concern for periorbital or orbital cellulitis. Return precautions discussed, patient encouraged to follow-up with primary care provider, and return to the ER if any worsening of symptoms. Patient verbalizes understanding and agreement of this plan. I encouraged patient to call or return to ER should she have any questions or concerns.  BP 137/86 mmHg  Pulse 77  Temp(Src) 98 F (36.7 C) (Oral)  Resp 18  SpO2 100%  LMP 07/04/2014 (Approximate)  Signed,  Ladona MowJoe Freyja Govea, PA-C 9:30 PM  Patient  seen and discussed with Dr. Bethann Berkshire, M.D.    Monte Fantasia, PA-C 07/29/14 2131  Benny Lennert, MD 07/30/14 [phone]

## 2014-07-29 NOTE — ED Notes (Signed)
Pt is cooperative at present. Given a snack as requested by patient

## 2014-07-29 NOTE — Discharge Instructions (Signed)

## 2014-07-30 ENCOUNTER — Emergency Department (HOSPITAL_COMMUNITY)
Admission: EM | Admit: 2014-07-30 | Discharge: 2014-07-30 | Disposition: A | Discharge status: Home / Self Care | Payer: Medicare Other | Attending: Emergency Medicine | Admitting: Emergency Medicine

## 2014-07-30 ENCOUNTER — Encounter (HOSPITAL_COMMUNITY): Payer: Self-pay | Admitting: *Deleted

## 2014-07-30 DIAGNOSIS — R05 Cough: Secondary | ICD-10-CM | POA: Insufficient documentation

## 2014-07-30 DIAGNOSIS — J441 Chronic obstructive pulmonary disease with (acute) exacerbation: Secondary | ICD-10-CM | POA: Diagnosis not present

## 2014-07-30 DIAGNOSIS — Z880 Allergy status to penicillin: Secondary | ICD-10-CM | POA: Diagnosis not present

## 2014-07-30 DIAGNOSIS — Z21 Asymptomatic human immunodeficiency virus [HIV] infection status: Secondary | ICD-10-CM | POA: Diagnosis not present

## 2014-07-30 DIAGNOSIS — R61 Generalized hyperhidrosis: Secondary | ICD-10-CM | POA: Insufficient documentation

## 2014-07-30 DIAGNOSIS — F319 Bipolar disorder, unspecified: Secondary | ICD-10-CM | POA: Diagnosis not present

## 2014-07-30 DIAGNOSIS — Z79899 Other long term (current) drug therapy: Secondary | ICD-10-CM | POA: Diagnosis not present

## 2014-07-30 DIAGNOSIS — R079 Chest pain, unspecified: Secondary | ICD-10-CM | POA: Diagnosis not present

## 2014-07-30 DIAGNOSIS — Z720 Tobacco use: Secondary | ICD-10-CM | POA: Diagnosis not present

## 2014-07-30 DIAGNOSIS — M791 Myalgia: Secondary | ICD-10-CM | POA: Insufficient documentation

## 2014-07-30 DIAGNOSIS — R059 Cough, unspecified: Secondary | ICD-10-CM

## 2014-07-30 LAB — URINE CULTURE: Colony Count: >=100000

## 2014-07-30 MED ORDER — BENZONATATE 100 MG PO CAPS
200.0000 mg | ORAL_CAPSULE | Freq: Once | ORAL | Status: AC
  Administered 2014-07-30: 200 mg via ORAL
  Filled 2014-07-30: qty 2

## 2014-07-30 MED ORDER — IBUPROFEN 800 MG PO TABS
800.0000 mg | ORAL_TABLET | Freq: Once | ORAL | Status: AC
  Administered 2014-07-30: 800 mg via ORAL
  Filled 2014-07-30: qty 1

## 2014-07-30 MED ORDER — IBUPROFEN 800 MG PO TABS: 800.0000 mg | ORAL_TABLET | Freq: Three times a day (TID) | ORAL | Status: AC | PRN

## 2014-07-30 MED ORDER — BENZONATATE 100 MG PO CAPS: 100.0000 mg | ORAL_CAPSULE | Freq: Two times a day (BID) | ORAL | Status: AC | PRN

## 2014-07-30 NOTE — Progress Notes (Signed)
CARE MANAGEMENT NOTE 07/30/2014  Patient:  Raelyn NumberLLEN,Padme   Account Number:  [id number]  Date Initiated:  07/30/2014  Documentation initiated by:  [facility]Bond Grieshop  Subjective/Objective Assessment:   Facial Swelling      Action/Plan:   Anticipated DC Date:  07/29/2014   Anticipated DC Plan:  HOME/SELF CARE  In-house referral  Clinical Social Worker      DC Planning Services  CM consult  Medication Assistance      Choice offered to / List presented to:             Status of service:  Completed, signed off Medicare Important Message given?   (If response is "NO", the following Medicare IM given date fields will be blank) Date Medicare IM given:   Medicare IM given by:   Date Additional Medicare IM given:   Additional Medicare IM given by:    Discharge Disposition:  HOME/SELF CARE  Per UR Regulation:    If discussed at Long Length of Stay Meetings, dates discussed:    Comments:  07/30/2014 late entry 07/29/2014 1115 NCM spoke to pt and states she receives her disability check on 08/03/2014. States she currently does not have any funds for medications. NCM explained Benadryl and Claritin are OTC medications. No assistance programs for OTC. States Benadryl and Claritin can be purchased at [facility] for $4-5 dollars. Requested bus pass. NCM contacted SW for transportation. Isidoro DonningAlesia Sakai Heinle RN CCM Case Mgmt phone [phone]

## 2014-07-30 NOTE — ED Provider Notes (Signed)
CSN: [id number]     Arrival date & time 07/30/14  2106 History  This chart was scribed for [name], MD by Annye Asa, ED Scribe. This patient was seen in room B15C/B15C and the patient's care was started at 11:00 PM.    Chief Complaint  Patient presents with  . Cough  . Chest Pain   Patient is a 54 y.o. female presenting with chest pain. The history is provided by the patient. No language interpreter was used.  Chest Pain   HPI Comments: Natalie Barrett is a 54 y.o. female with past medical history of COPD, asthma who presents to the Emergency Department complaining of 6 months of nonproductive cough and associated chest pain. She reports that she becomes SOB and "clammy" with her painful coughing episodes; her SOB is not dependent on exertion. She also notes "night sweats" and generalized myalgias. There's been no significant change in her symptoms over the past several weeks.  She did receive a flu shot this year. She is a nonsmoker currently but states she smoked in the past.   Past Medical History  Diagnosis Date  . Bipolar 1 disorder   . HIV antibody positive   . COPD (chronic obstructive pulmonary disease)   . Asthma    Past Surgical History  Procedure Laterality Date  . Cesarean section    . Tubal ligation     Family History  Problem Relation Age of Onset  . Cancer Other    History  Substance Use Topics  . Smoking status: Current Some Day Smoker    Types: Cigarettes  . Smokeless tobacco: Never Used  . Alcohol Use: No   OB History    No data available     Review of Systems A complete 10 system review of systems was obtained and all systems are negative except as noted in the HPI and PMH.    Allergies  Ciprofloxacin; Penicillins; and Promethazine  Home Medications   Prior to Admission medications   Medication Sig Start Date End Date Taking? Authorizing Provider  albuterol (PROVENTIL HFA;VENTOLIN HFA) 108 (90 BASE) MCG/ACT inhaler Inhale 1-2 puffs into the  lungs every 6 (six) hours as needed for wheezing or shortness of breath.   Yes Historical Provider, MD  elvitegravir-cobicistat-emtricitabine-tenofovir (STRIBILD) 150-150-200-300 MG TABS tablet Take 1 tablet by mouth daily with breakfast. 07/23/14  Yes Arthor Captain, PA-C  QUEtiapine (SEROQUEL) 50 MG tablet Take 1 tablet (50 mg total) by mouth 2 (two) times daily. 07/23/14  Yes Abigail Harris, PA-C  Benzocaine-Pectin 15-5 MG LOZG Use as directed 1 lozenge in the mouth or throat every 2 (two) hours as needed. Patient not taking: Reported on 07/30/2014 07/10/14   Ames Dura, MD  ciprofloxacin (CIPRO) 500 MG tablet Take 1 tablet (500 mg total) by mouth 2 (two) times daily. Patient not taking: Reported on 07/28/2014 07/28/14   Antony Madura, PA-C  dicyclomine (BENTYL) 20 MG tablet Take 1 tablet (20 mg total) by mouth 2 (two) times daily. Patient not taking: Reported on 07/12/2014 04/02/14   Gilda Crease, MD  diphenhydrAMINE (BENADRYL) 25 MG tablet Take 1 tablet (25 mg total) by mouth every 6 (six) hours. Patient taking differently: Take 25 mg by mouth every 6 (six) hours as needed (allergic reaction).  07/29/14   Monte Fantasia, PA-C  guaiFENesin-codeine 100-10 MG/5ML syrup Take 5-10 mLs by mouth every 6 (six) hours as needed for cough. Patient not taking: Reported on 07/25/2014 07/23/14   Arthor Captain, PA-C  loratadine [facility])  10 MG tablet Take 1 tablet (10 mg total) by mouth daily. One po daily x 5 days Patient taking differently: Take 10 mg by mouth daily as needed (allergic reaction). One po daily x 5 days 07/29/14   Monte FantasiaJoseph W Mintz, PA-C  nitrofurantoin, macrocrystal-monohydrate, (MACROBID) 100 MG capsule Take 1 capsule (100 mg total) by mouth 2 (two) times daily with a meal. Patient not taking: Reported on 07/12/2014 03/26/14   Shuvon Rankin, NP  nitrofurantoin, macrocrystal-monohydrate, (MACROBID) 100 MG capsule Take 1 capsule (100 mg total) by mouth 2 (two) times daily. X 7 days Patient not  taking: Reported on 07/12/2014 04/02/14   Gilda Creasehristopher J. Pollina, MD  OLANZapine (ZYPREXA) 5 MG tablet Take 1 tablet (5 mg total) by mouth at bedtime. Patient not taking: Reported on 07/12/2014 04/02/14   Rolland PorterMark James, MD  OLANZapine zydis (ZYPREXA) 5 MG disintegrating tablet Take 1 tablet (5 mg total) by mouth 2 (two) times daily. Patient not taking: Reported on 07/12/2014 03/26/14   Shuvon Rankin, NP  ondansetron (ZOFRAN ODT) 4 MG disintegrating tablet 4mg  ODT q4 hours prn nausea/vomit Patient not taking: Reported on 07/29/2014 07/28/14   Benny LennertJoseph L Zammit, MD  predniSONE (DELTASONE) 20 MG tablet Take 2 tablets (40 mg total) by mouth daily. Patient not taking: Reported on 07/25/2014 07/20/14   Antony MaduraKelly Humes, PA-C   BP 138/92 mmHg  Pulse 84  Temp(Src) 98.4 F (36.9 C)  Resp 18  SpO2 98%  LMP 07/04/2014 (Approximate) Physical Exam  Constitutional: She is oriented to person, place, and time. She appears well-developed and well-nourished. No distress.  HENT:  Head: Normocephalic and atraumatic.  Nose: Nose normal.  Mouth/Throat: Oropharynx is clear and moist. No oropharyngeal exudate.  Eyes: Conjunctivae and EOM are normal. Pupils are equal, round, and reactive to light. No scleral icterus.  Neck: Normal range of motion. Neck supple. No JVD present. No tracheal deviation present. No thyromegaly present.  Cardiovascular: Normal rate, regular rhythm and normal heart sounds.  Exam reveals no gallop and no friction rub.   No murmur heard. Pulmonary/Chest: Effort normal and breath sounds normal. No respiratory distress. She has no wheezes. She exhibits no tenderness.  Abdominal: Soft. Bowel sounds are normal. She exhibits no distension and no mass. There is no tenderness. There is no rebound and no guarding.  Musculoskeletal: Normal range of motion. She exhibits no edema or tenderness.  Lymphadenopathy:    She has no cervical adenopathy.  Neurological: She is alert and oriented to person, place, and time.  No cranial nerve deficit. She exhibits normal muscle tone.  Skin: Skin is warm and dry. No rash noted. No erythema. No pallor.  Nursing note and vitals reviewed.   ED Course  Procedures   DIAGNOSTIC STUDIES: Oxygen Saturation is 98% on RA, normal by my interpretation.    COORDINATION OF CARE: 11:03 PM Discussed treatment plan with pt at bedside and pt agreed to plan.  Labs Review Labs Reviewed - No data to display  Imaging Review No results found.   EKG Interpretation   Date/Time:  Sunday July 30 2014 21:13:29 EST Ventricular Rate:  95 PR Interval:  182 QRS Duration: 72 QT Interval:  346 QTC Calculation: 434 R Axis:   80 Text Interpretation:  Normal sinus rhythm Septal infarct , age  undetermined Abnormal ECG No significant change since last tracing  Confirmed by Erroll Lunani, Christyn Gutkowski Ayokunle [phone]([id number]) on 07/30/2014 10:56:22 PM      MDM   Final diagnoses:  Cough   patient presents emergency  department for chronic cough lasting 6 months. She's had multiple emergency department visits including 17 in the past 6 months.  She denies any significant change to her symptoms. I do not believe chest x-ray is warranted as patient does not have symptoms of pneumonia. Her cough is nonproductive. Her vital signs remain within her normal limits, she was given [employer] and Motrin for relief, prescriptions were given, patient is safe for discharge and primary care follow-up within 3 days.   I personally performed the services described in this documentation, which was scribed in my presence. The recorded information has been reviewed and is accurate.      [name], MD 07/31/14 0002

## 2014-07-30 NOTE — ED Notes (Signed)
The pt is c/o a cough  For approx 6 months. She arrived by gesm.  [facility].  She  Reports no temp  Non-productive cough.  Hx asthma and hiv pos taking no meds

## 2014-07-30 NOTE — Discharge Instructions (Signed)
Cough, Adult Natalie Barrett, your cough is likely related to your COPD or to a viral infection.  This may persist all the time due to your COPD.  Follow up with your primary care physician within 3 days for continued management.  If symptoms worsen, come back to the ED immediately. Thank you.   A cough is a reflex. It helps you clear your throat and airways. A cough can help heal your body. A cough can last 2 or 3 weeks (acute) or may last more than 8 weeks (chronic). Some common causes of a cough can include an infection, allergy, or a cold. HOME CARE  Only take medicine as told by your doctor.  If given, take your medicines (antibiotics) as told. Finish them even if you start to feel better.  Use a cold steam vaporizer or humidifier in your home. This can help loosen thick spit (secretions).  Sleep so you are almost sitting up (semi-upright). Use pillows to do this. This helps reduce coughing.  Rest as needed.  Stop smoking if you smoke. GET HELP RIGHT AWAY IF:  You have yellowish-white fluid (pus) in your thick spit.  Your cough gets worse.  Your medicine does not reduce coughing, and you are losing sleep.  You cough up blood.  You have trouble breathing.  Your pain gets worse and medicine does not help.  You have a fever. MAKE SURE YOU:   Understand these instructions.  Will watch your condition.  Will get help right away if you are not doing well or get worse. Document Released: 01/30/2011 Document Revised: 10/03/2013 Document Reviewed: 01/30/2011 [facility] Patient Information 2015 [place], [place]. This information is not intended to replace advice given to you by your health care provider. Make sure you discuss any questions you have with your health care provider.

## 2014-07-31 ENCOUNTER — Telehealth (HOSPITAL_BASED_OUTPATIENT_CLINIC_OR_DEPARTMENT_OTHER): Payer: Self-pay | Admitting: Emergency Medicine

## 2014-07-31 ENCOUNTER — Emergency Department (HOSPITAL_COMMUNITY)
Admission: EM | Admit: 2014-07-31 | Discharge: 2014-07-31 | Disposition: A | Discharge status: Home / Self Care | Payer: Medicare Other | Attending: Emergency Medicine | Admitting: Emergency Medicine

## 2014-07-31 ENCOUNTER — Encounter (HOSPITAL_COMMUNITY): Payer: Self-pay | Admitting: Emergency Medicine

## 2014-07-31 DIAGNOSIS — Z880 Allergy status to penicillin: Secondary | ICD-10-CM | POA: Diagnosis not present

## 2014-07-31 DIAGNOSIS — Z87891 Personal history of nicotine dependence: Secondary | ICD-10-CM | POA: Diagnosis not present

## 2014-07-31 DIAGNOSIS — R079 Chest pain, unspecified: Secondary | ICD-10-CM | POA: Diagnosis present

## 2014-07-31 DIAGNOSIS — R0789 Other chest pain: Secondary | ICD-10-CM | POA: Insufficient documentation

## 2014-07-31 DIAGNOSIS — Z79899 Other long term (current) drug therapy: Secondary | ICD-10-CM | POA: Insufficient documentation

## 2014-07-31 DIAGNOSIS — J449 Chronic obstructive pulmonary disease, unspecified: Secondary | ICD-10-CM | POA: Diagnosis not present

## 2014-07-31 DIAGNOSIS — Z21 Asymptomatic human immunodeficiency virus [HIV] infection status: Secondary | ICD-10-CM | POA: Diagnosis not present

## 2014-07-31 DIAGNOSIS — F319 Bipolar disorder, unspecified: Secondary | ICD-10-CM | POA: Insufficient documentation

## 2014-07-31 NOTE — ED Notes (Signed)
[facility]/o sternal cp started last night, intermittent since then. Increases with inspiration, coughing

## 2014-07-31 NOTE — ED Provider Notes (Signed)
CSN: [id number]     Arrival date & time 07/31/14  2109 History   First MD Initiated Contact with Patient 07/31/14 2121     Chief Complaint  Patient presents with  . Chest Pain     (Consider location/radiation/quality/duration/timing/severity/associated sxs/prior Treatment) HPI Patient is with ongoing chest pain. Patient has been seen here multiple times, other facilities multiple times in the recent past for ongoing pain.  She states the pain is unchanged, nonradiating, sore, anterior. Patient states this while she is wearing headphones, listening to music, clearly in no distress. She does acknowledge recent history of cough, denies substantial ongoing cough, also denies any fever or chills.  Past Medical History  Diagnosis Date  . Bipolar 1 disorder   . HIV antibody positive   . COPD (chronic obstructive pulmonary disease)   . Asthma    Past Surgical History  Procedure Laterality Date  . Cesarean section    . Tubal ligation     Family History  Problem Relation Age of Onset  . Cancer Other    History  Substance Use Topics  . Smoking status: Former Smoker    Types: Cigarettes  . Smokeless tobacco: Former Neurosurgeon    Quit date: 07/01/2014  . Alcohol Use: No   OB History    No data available     Review of Systems  All other systems reviewed and are negative.     Allergies  Ciprofloxacin; Penicillins; and Promethazine  Home Medications   Prior to Admission medications   Medication Sig Start Date End Date Taking? Authorizing Provider  albuterol (PROVENTIL HFA;VENTOLIN HFA) 108 (90 BASE) MCG/ACT inhaler Inhale 1-2 puffs into the lungs every 6 (six) hours as needed for wheezing or shortness of breath.   Yes Historical Provider, MD  benzonatate (TESSALON PERLES) 100 MG capsule Take 1 capsule (100 mg total) by mouth 2 (two) times daily as needed for cough. 07/30/14  Yes [name], MD  diphenhydrAMINE (BENADRYL) 25 MG tablet Take 1 tablet (25 mg total) by mouth every 6  (six) hours. Patient taking differently: Take 25 mg by mouth every 6 (six) hours as needed (allergic reaction).  07/29/14  Yes Monte Fantasia, PA-C  elvitegravir-cobicistat-emtricitabine-tenofovir (STRIBILD) 150-150-200-300 MG TABS tablet Take 1 tablet by mouth daily with breakfast. 07/23/14  Yes Arthor Captain, PA-C  loratadine (CLARITIN) 10 MG tablet Take 1 tablet (10 mg total) by mouth daily. One po daily x 5 days Patient taking differently: Take 10 mg by mouth daily as needed (allergic reaction). One po daily x 5 days 07/29/14  Yes Monte Fantasia, PA-C  Benzocaine-Pectin 15-5 MG LOZG Use as directed 1 lozenge in the mouth or throat every 2 (two) hours as needed. Patient not taking: Reported on 07/30/2014 07/10/14   Ames Dura, MD  ciprofloxacin (CIPRO) 500 MG tablet Take 1 tablet (500 mg total) by mouth 2 (two) times daily. Patient not taking: Reported on 07/31/2014 07/28/14   Antony Madura, PA-C  dicyclomine (BENTYL) 20 MG tablet Take 1 tablet (20 mg total) by mouth 2 (two) times daily. Patient not taking: Reported on 07/12/2014 04/02/14   Gilda Crease, MD  guaiFENesin-codeine 100-10 MG/5ML syrup Take 5-10 mLs by mouth every 6 (six) hours as needed for cough. Patient not taking: Reported on 07/25/2014 07/23/14   Arthor Captain, PA-C  ibuprofen (ADVIL,MOTRIN) 800 MG tablet Take 1 tablet (800 mg total) by mouth every 8 (eight) hours as needed for moderate pain. 07/30/14   [name], MD  nitrofurantoin, Gara Kroner, (  MACROBID) 100 MG capsule Take 1 capsule (100 mg total) by mouth 2 (two) times daily with a meal. Patient not taking: Reported on 07/12/2014 03/26/14   Shuvon Rankin, NP  nitrofurantoin, macrocrystal-monohydrate, (MACROBID) 100 MG capsule Take 1 capsule (100 mg total) by mouth 2 (two) times daily. X 7 days Patient not taking: Reported on 07/12/2014 04/02/14   Gilda Creasehristopher J. Pollina, MD  OLANZapine (ZYPREXA) 5 MG tablet Take 1 tablet (5 mg total) by mouth at  bedtime. Patient not taking: Reported on 07/12/2014 04/02/14   Rolland PorterMark James, MD  OLANZapine zydis (ZYPREXA) 5 MG disintegrating tablet Take 1 tablet (5 mg total) by mouth 2 (two) times daily. Patient not taking: Reported on 07/12/2014 03/26/14   Shuvon Rankin, NP  ondansetron (ZOFRAN ODT) 4 MG disintegrating tablet 4mg  ODT q4 hours prn nausea/vomit Patient not taking: Reported on 07/29/2014 07/28/14   Benny LennertJoseph L Zammit, MD  predniSONE (DELTASONE) 20 MG tablet Take 2 tablets (40 mg total) by mouth daily. Patient not taking: Reported on 07/25/2014 07/20/14   Antony MaduraKelly Humes, PA-C  QUEtiapine (SEROQUEL) 50 MG tablet Take 1 tablet (50 mg total) by mouth 2 (two) times daily. Patient not taking: Reported on 07/31/2014 07/23/14   Arthor CaptainAbigail Harris, PA-C   BP 151/82 mmHg  Pulse 80  Resp 20  SpO2 98%  LMP 07/17/2014 Physical Exam  Constitutional: She is oriented to person, place, and time. She appears well-developed and well-nourished. No distress.  HENT:  Head: Normocephalic and atraumatic.  Eyes: Conjunctivae and EOM are normal. Right eye exhibits no discharge. Left eye exhibits no discharge.  Pulmonary/Chest: Effort normal. No stridor. No respiratory distress.  Musculoskeletal: She exhibits no edema.  Neurological: She is alert and oriented to person, place, and time. No cranial nerve deficit. Coordination normal.  Patient sitting upright in bed, no facial asymmetry, no gross asymmetry of strength, no speech deficit  Psychiatric: She has a normal mood and affect.  Patient seemingly has little insight into her condition, after I discussed with her her 1525 emergency department visits in the past 2 months.   Nursing note and vitals reviewed.   ED Course  Procedures (including critical care time) I reviewed the patient's chart, including the ability to access charts from other facilities. Patient has 25 emergency department visits in the past 2 months.   EKG Interpretation   Date/Time:  Monday July 31 2014 21:16:22 EST Ventricular Rate:  84 PR Interval:  179 QRS Duration: 84 QT Interval:  367 QTC Calculation: 434 R Axis:   83 Text Interpretation:  Sinus rhythm Probable left atrial enlargement  Probable left ventricular hypertrophy Artifact in lead(s) V3 V4 Sinus  rhythm Artifact No significant change since last tracing Abnormal ekg  Confirmed by Gerhard MunchLOCKWOOD, Samiya Mervin  MD [phone]) on 07/31/2014 9:22:34 PM     After the initial evaluation discussed patient's case with our case management team. Patient has been seen here several times, and she is familiar with the patient. We arranged for follow-up in the outpatient clinic in 9 hours. MDM   Final diagnoses:  Atypical chest pain    Patient in no distress presents with ongoing chest pain. Patient's vital signs are stable, she has had thorough evaluation several times in the very recent past, and today's presentation is consistent with multiple prior visits. With reassuring EKG, vitals, patient was discharged to follow-up with our outpatient clinical center in 9 hours.  Gerhard Munchobert Nya Monds, MD 07/31/14 [phone]

## 2014-07-31 NOTE — Telephone Encounter (Signed)
Post ED Visit - Positive Culture Follow-up  Culture report reviewed by antimicrobial stewardship pharmacist: []  Wes Dulaney, Pharm.D., BCPS [x]  Celedonio MiyamotoJeremy Frens, Pharm.D., BCPS []  Georgina PillionElizabeth Martin, Pharm.D., BCPS []  WeirMinh Pham, [address]Ph[address]D., BCPS, AAHIVP []  Estella HuskMichelle Turner, Pharm.D., BCPS, AAHIVP []  Elder CyphersLorie Poole, [address]Pharm[address]D., BCPS  Positive urine culture Enterobacter  Treated with ciprofloxacin, organism sensitive to the same and no further patient follow-up is required at this time.  Berle MullMiller, Nainika Newlun 07/31/2014, 10:42 AM

## 2014-07-31 NOTE — Discharge Instructions (Signed)
Please be sure to follow-up this morning with our outpatient clinical center.  Return here for concerning changes in your condition.   Chest Pain (Nonspecific) It is often hard to give a diagnosis for the cause of chest pain. There is always a chance that your pain could be related to something serious, such as a heart attack or a blood clot in the lungs. You need to follow up with your doctor. HOME CARE  If antibiotic medicine was given, take it as directed by your doctor. Finish the medicine even if you start to feel better.  For the next few days, avoid activities that bring on chest pain. Continue physical activities as told by your doctor.  Do not use any tobacco products. This includes cigarettes, chewing tobacco, and e-cigarettes.  Avoid drinking alcohol.  Only take medicine as told by your doctor.  Follow your doctor's suggestions for more testing if your chest pain does not go away.  Keep all doctor visits you made. GET HELP IF:  Your chest pain does not go away, even after treatment.  You have a rash with blisters on your chest.  You have a fever. GET HELP RIGHT AWAY IF:   You have more pain or pain that spreads to your arm, neck, jaw, back, or belly (abdomen).  You have shortness of breath.  You cough more than usual or cough up blood.  You have very bad back or belly pain.  You feel sick to your stomach (nauseous) or throw up (vomit).  You have very bad weakness.  You pass out (faint).  You have chills. This is an emergency. Do not wait to see if the problems will go away. Call your local emergency services (911 in [place] Do not drive yourself to the hospital. MAKE SURE YOU:   Understand these instructions.  Will watch your condition.  Will get help right away if you are not doing well or get worse. Document Released: 11/05/2007 Document Revised: 05/24/2013 Document Reviewed: 11/05/2007 [facility] Patient Information 2015 [place], [place]. This  information is not intended to replace advice given to you by your health care provider. Make sure you discuss any questions you have with your health care provider.

## 2014-08-01 NOTE — Care Management (Signed)
ED CM contacted by Dr. Vanita Panda concerning patient having had 16 ED visits in 2 months. Patient is known to ED CM spoke with patient last week concerning transitional care at the [facility] and also [facility] clinic to f/u. Patient is homeless, staying at [facility], active at the [facility] as well.  Met with patient at bedside, and discussed the walk-in clinic at the [facility] in the morning patient is agreeable. Updated Dr. Vanita Panda patient will f/u in the am at the [facility] walk-in clinic. No further ED CM needs identified

## 2016-07-12 IMAGING — CR DG CHEST 2V
2 series · 2 of 2 positions shown · non-contrast
Comparison: None.

CLINICAL DATA: Dry cough. Sneezing for 1 week. Fever for 1 day.
HIV. Bipolar disorder. Initial encounter.

EXAM:
CHEST  2 VIEW

[chest pa]
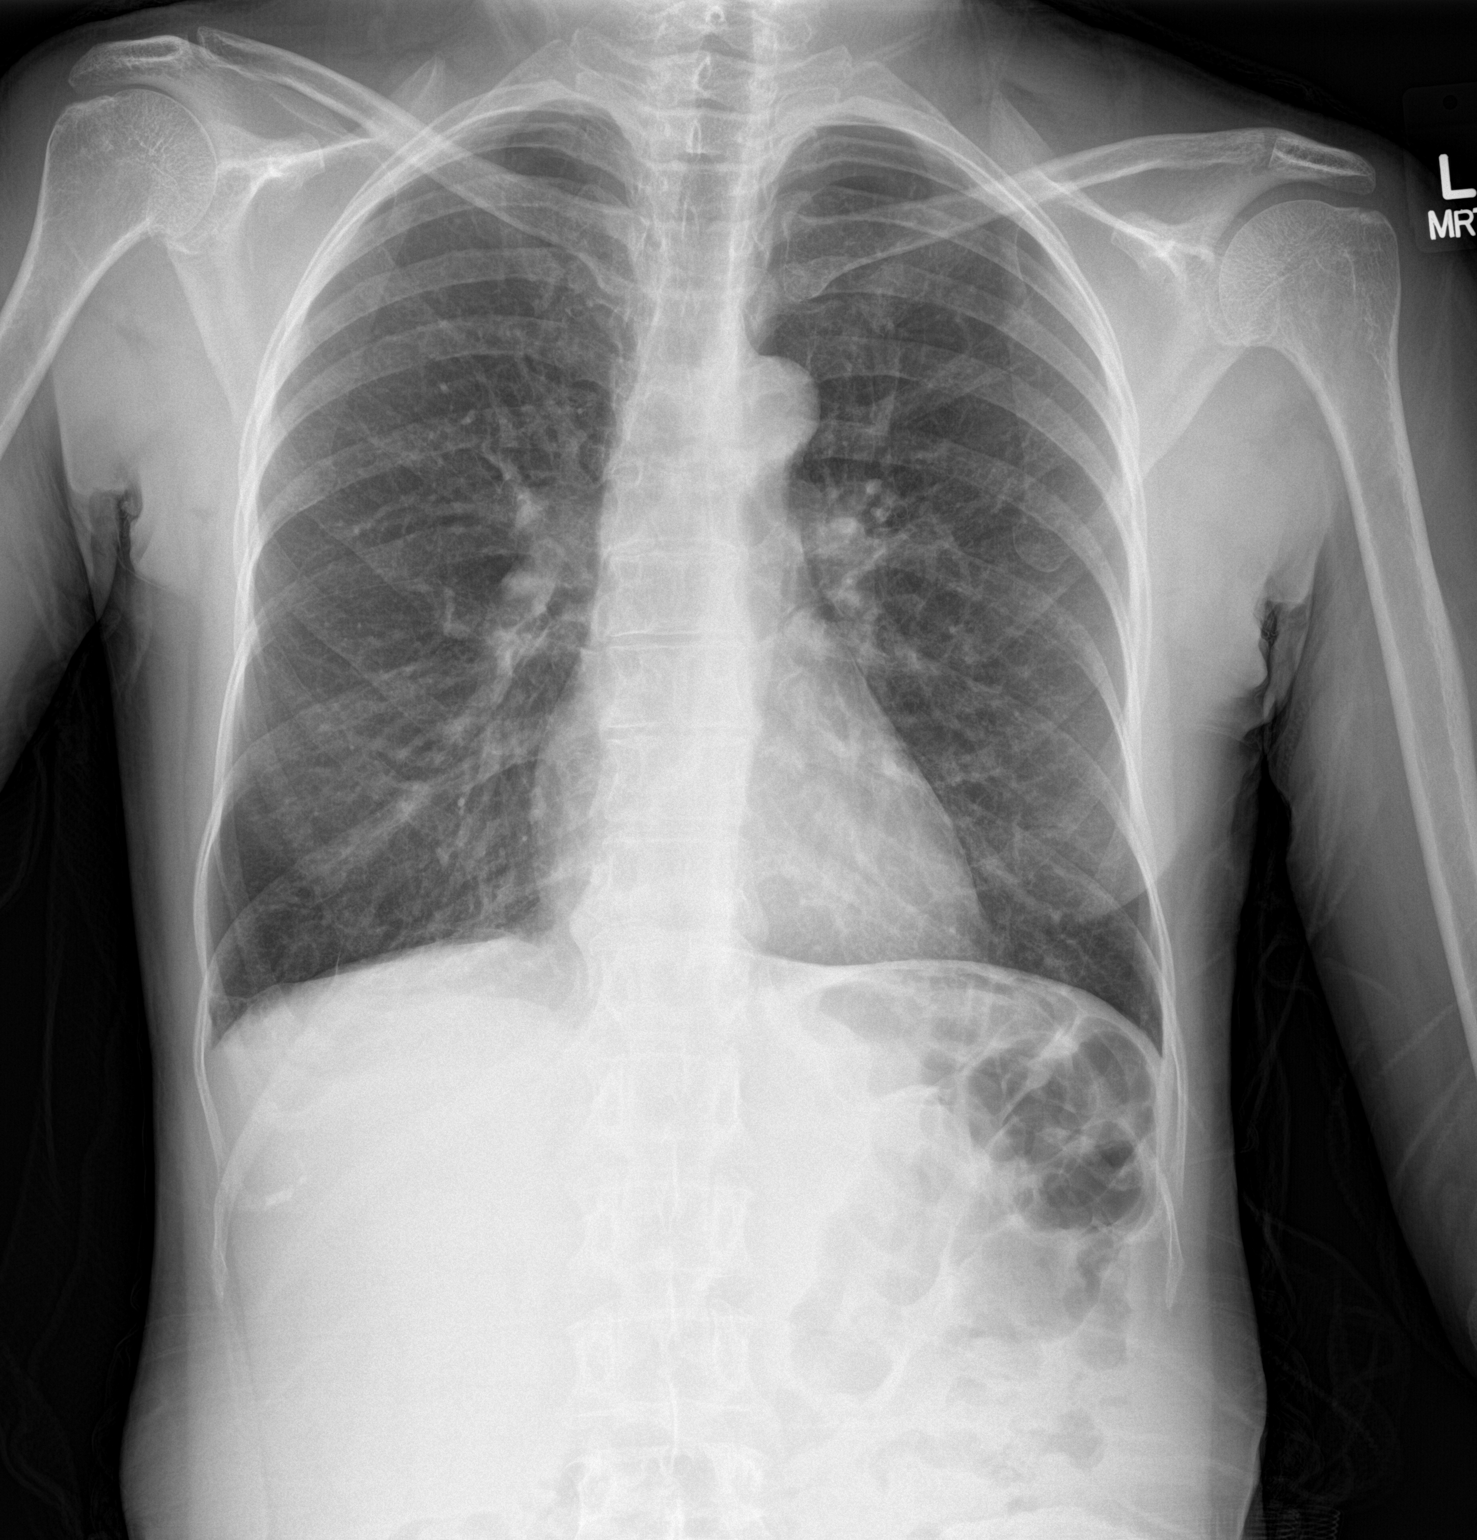

[chest lat]
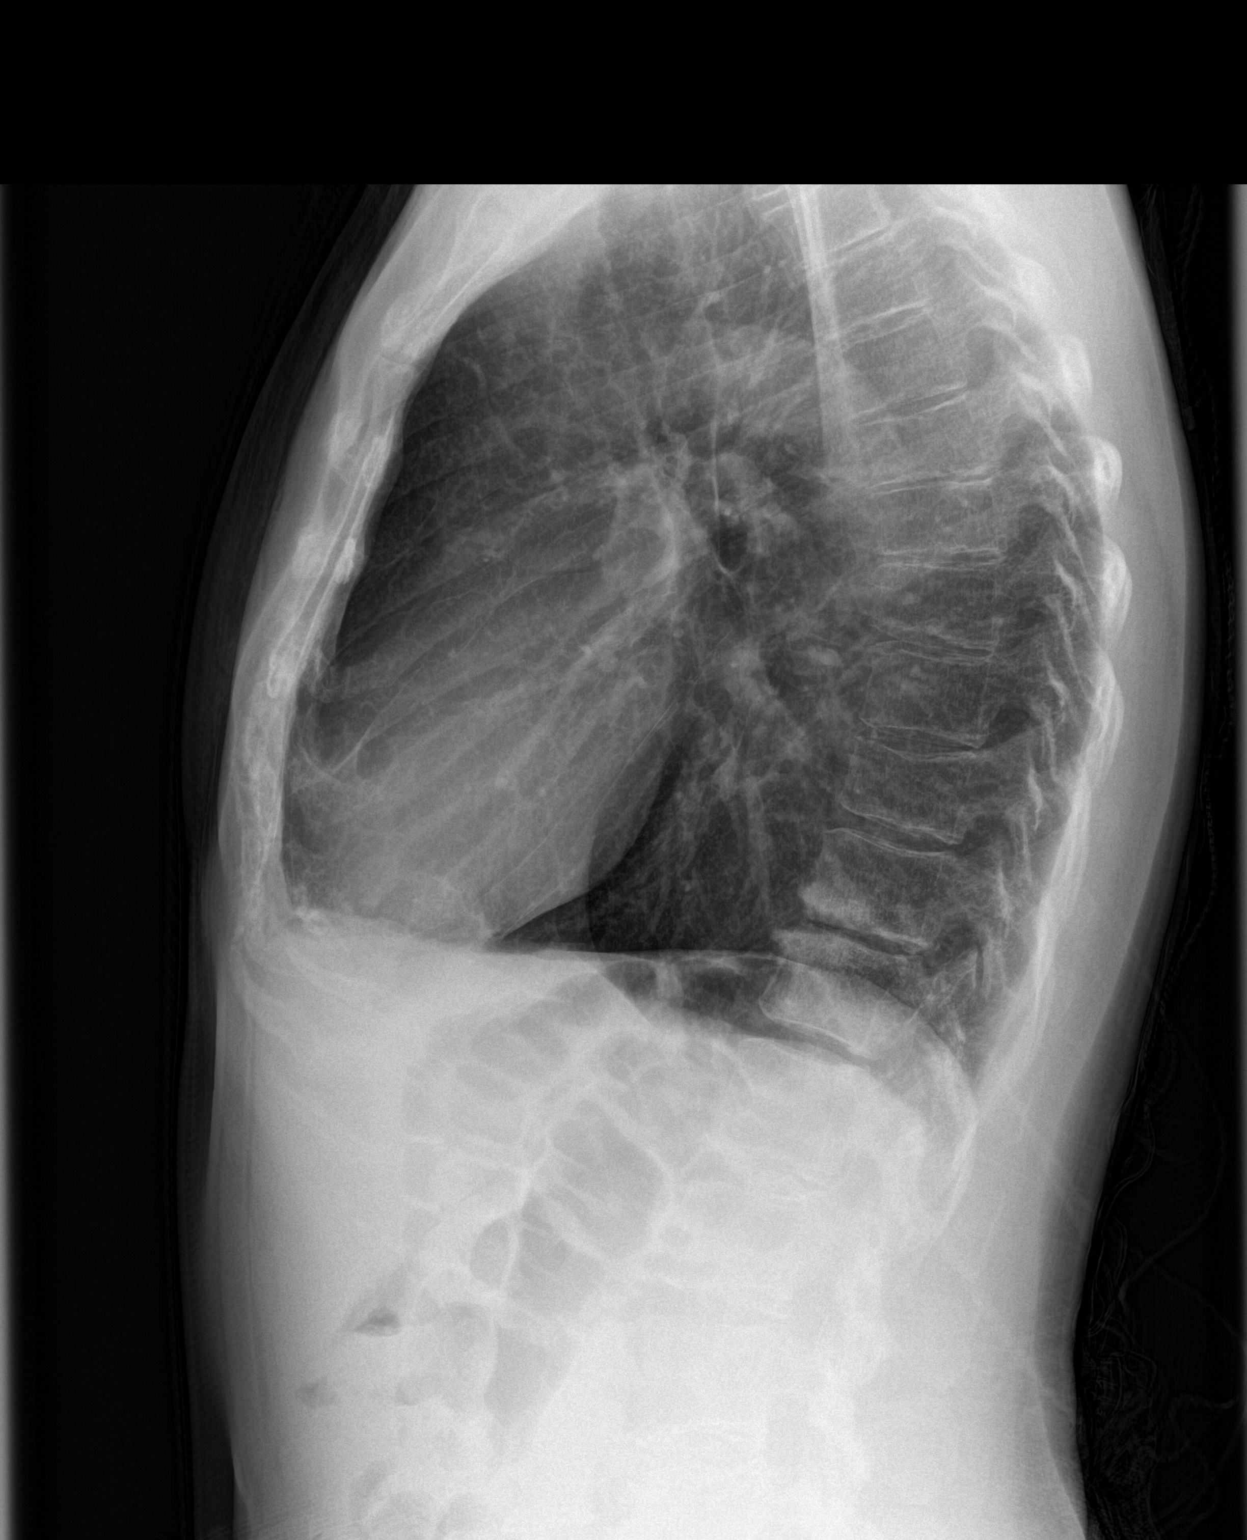

[2 of 2 positions shown; findings below may reference images not displayed]

FINDINGS: Emphysema is present with flattening of the hemidiaphragms. The
cardiopericardial silhouette and mediastinal contours are within
normal limits. There is no airspace disease. No pleural effusion.
Thoracic spondylosis is present.
IMPRESSION: Emphysema without acute cardiopulmonary disease.

## 2016-07-20 IMAGING — DX DG CHEST 2V
2 series · 2 of 2 positions shown · non-contrast
Comparison: 07/10/2014

CLINICAL DATA: Cough and body aches for 2 weeks

EXAM:
CHEST  2 VIEW

[chest pa]
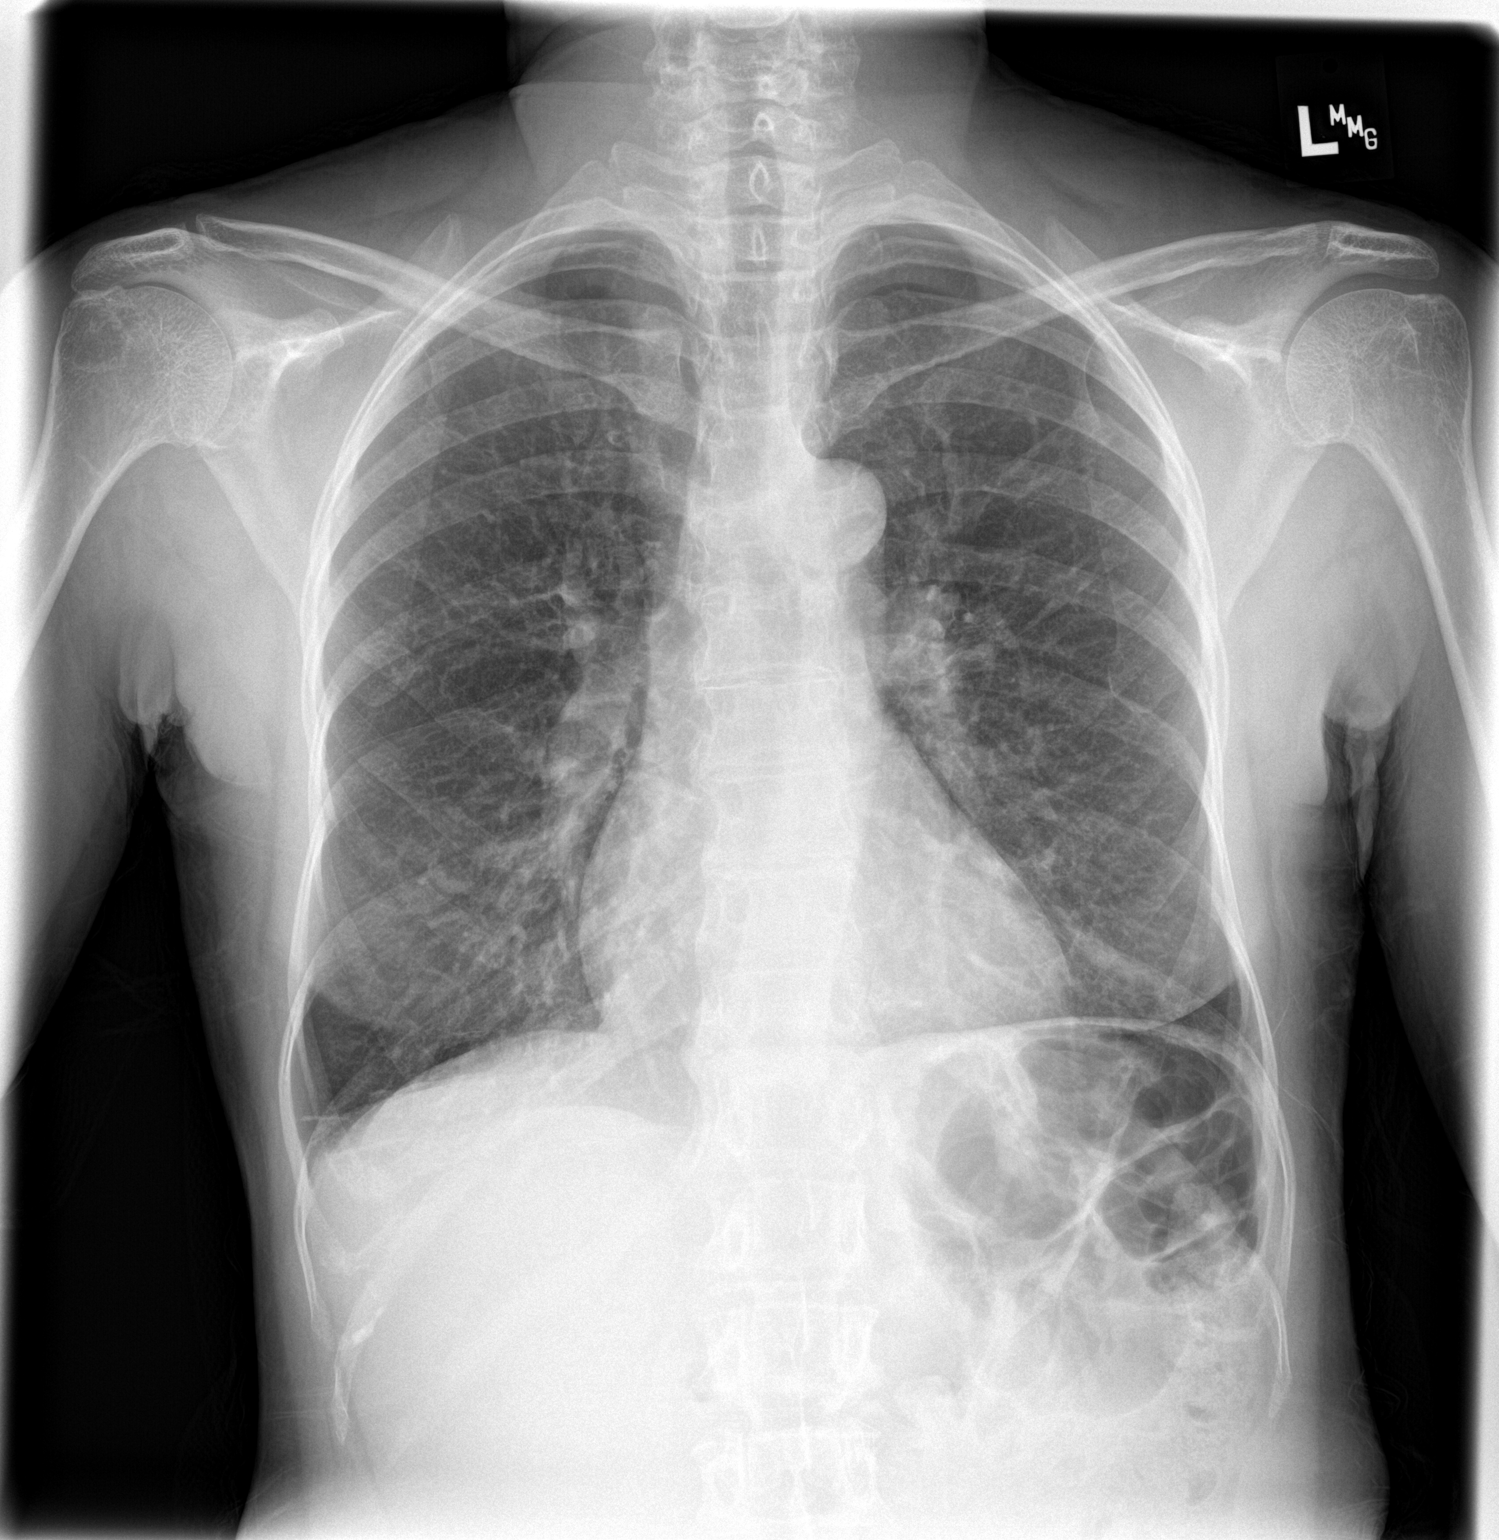

[chest lat]
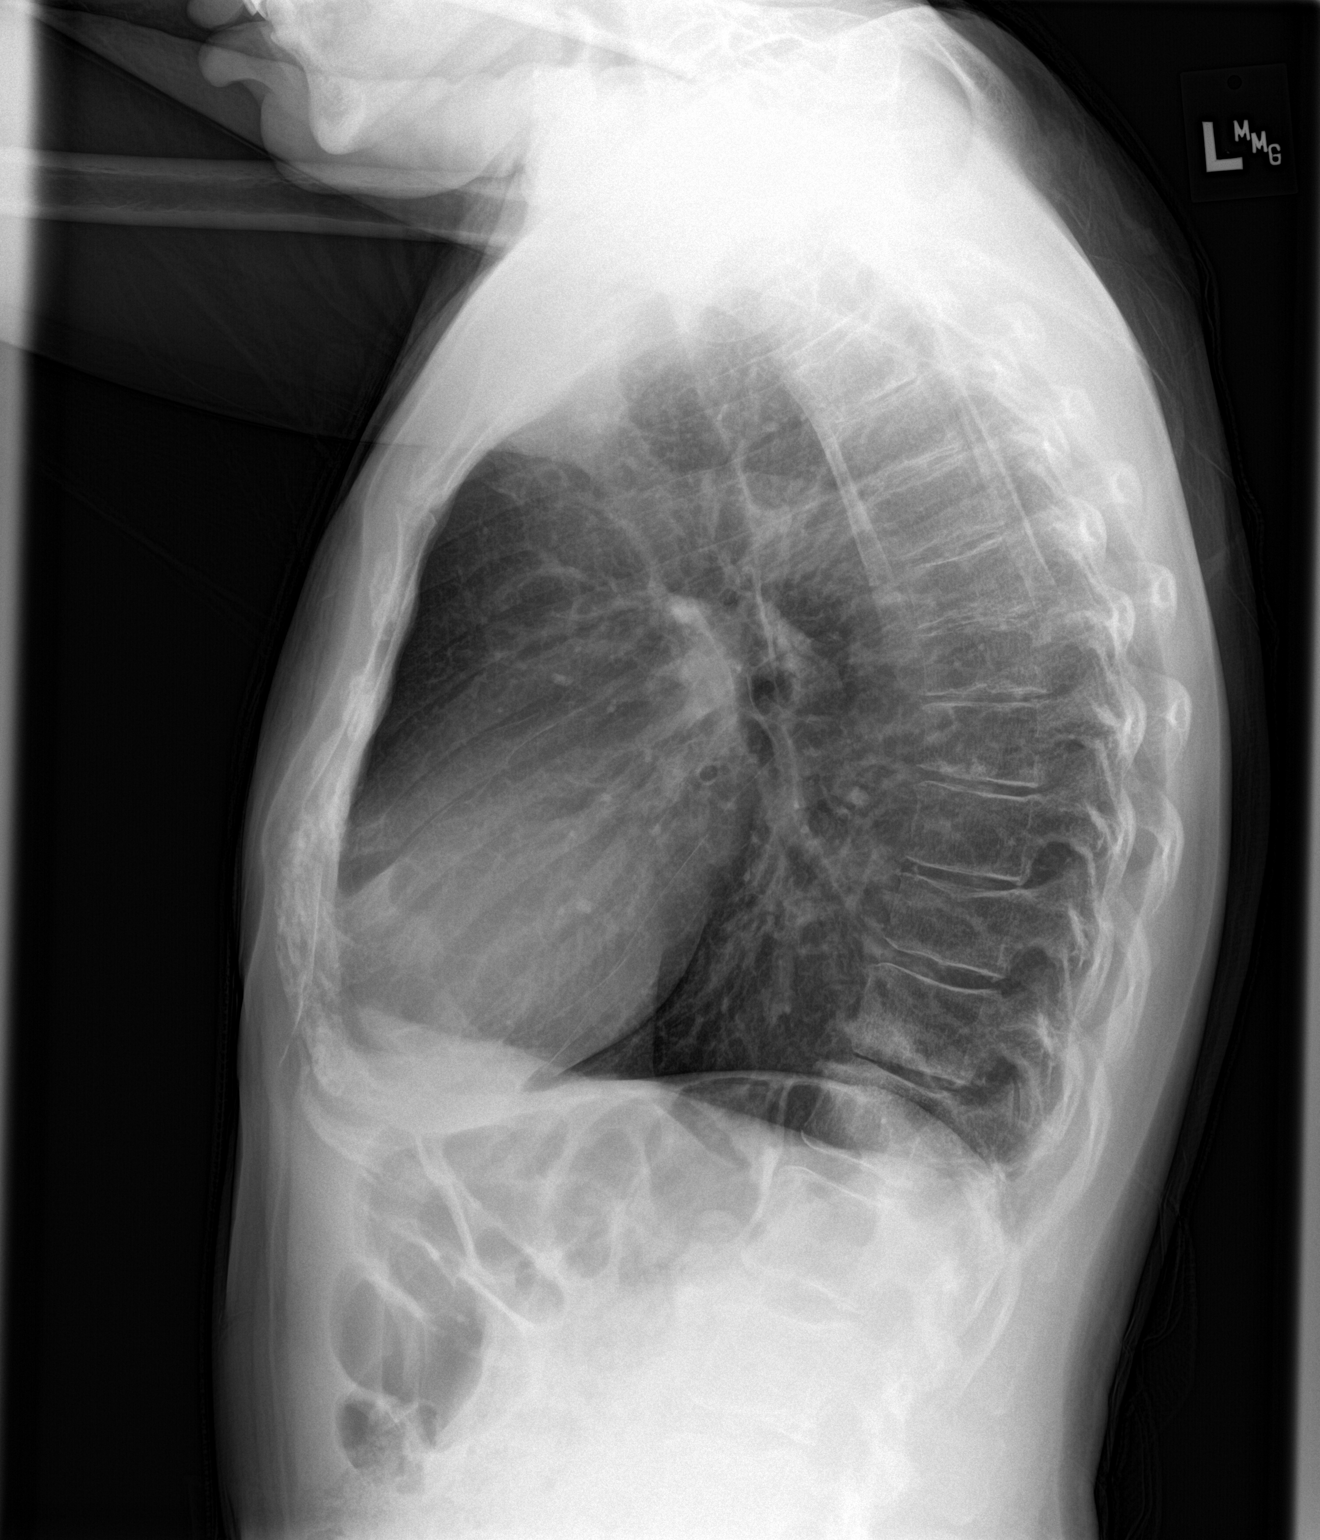

[2 of 2 positions shown; findings below may reference images not displayed]

FINDINGS: There is moderate hyperinflation. This is unchanged. No superimposed
acute infiltrate or congestive failure is evident. There are no
pleural effusions. Hilar and mediastinal contours are unremarkable
and unchanged.
IMPRESSION: Hyperinflation consistent with COPD. No acute cardiopulmonary
findings.
